# Patient Record
Sex: Male | Born: 1950 | Race: White | Hispanic: No | Marital: Married | Smoking: Former smoker
Health system: Southern US, Community
[De-identification: ages and names within clinical notes are randomized; demographics above are authoritative.]

## PROBLEM LIST (undated history)

## (undated) DIAGNOSIS — F329 Major depressive disorder, single episode, unspecified: Secondary | ICD-10-CM

## (undated) DIAGNOSIS — Z8619 Personal history of other infectious and parasitic diseases: Secondary | ICD-10-CM

## (undated) DIAGNOSIS — I10 Essential (primary) hypertension: Secondary | ICD-10-CM

## (undated) DIAGNOSIS — F32A Depression, unspecified: Secondary | ICD-10-CM

## (undated) HISTORY — DX: Depression, unspecified: F32.A

## (undated) HISTORY — DX: Major depressive disorder, single episode, unspecified: F32.9

## (undated) HISTORY — DX: Essential (primary) hypertension: I10

## (undated) HISTORY — DX: Personal history of other infectious and parasitic diseases: Z86.19

## (undated) HISTORY — PX: KNEE SURGERY: SHX244

---

## 2001-07-29 ENCOUNTER — Encounter: Admission: RE | Admit: 2001-07-29 | Discharge: 2001-07-29 | Payer: Self-pay | Admitting: Family Medicine

## 2001-07-29 ENCOUNTER — Encounter: Payer: Self-pay | Admitting: Family Medicine

## 2001-07-30 ENCOUNTER — Encounter: Payer: Self-pay | Admitting: Family Medicine

## 2001-07-30 ENCOUNTER — Encounter: Admission: RE | Admit: 2001-07-30 | Discharge: 2001-07-30 | Payer: Self-pay | Admitting: Family Medicine

## 2003-07-29 ENCOUNTER — Other Ambulatory Visit: Payer: Self-pay

## 2006-02-04 DIAGNOSIS — Z87891 Personal history of nicotine dependence: Secondary | ICD-10-CM | POA: Insufficient documentation

## 2008-02-03 DIAGNOSIS — I10 Essential (primary) hypertension: Secondary | ICD-10-CM | POA: Insufficient documentation

## 2008-08-22 ENCOUNTER — Ambulatory Visit: Payer: Self-pay

## 2008-10-26 ENCOUNTER — Ambulatory Visit: Payer: Self-pay | Admitting: General Practice

## 2011-12-12 LAB — HM COLONOSCOPY

## 2013-04-26 ENCOUNTER — Observation Stay: Payer: Self-pay | Admitting: Internal Medicine

## 2013-04-26 LAB — CBC
HCT: 47.5 % (ref 40.0–52.0)
HGB: 15.7 g/dL (ref 13.0–18.0)
MCH: 31.3 pg (ref 26.0–34.0)
MCHC: 33 g/dL (ref 32.0–36.0)
MCV: 95 fL (ref 80–100)
Platelet: 197 10*3/uL (ref 150–440)
RBC: 5.01 10*6/uL (ref 4.40–5.90)
RDW: 13.4 % (ref 11.5–14.5)
WBC: 12.5 10*3/uL — ABNORMAL HIGH (ref 3.8–10.6)

## 2013-04-26 LAB — COMPREHENSIVE METABOLIC PANEL
ALK PHOS: 70 U/L
Albumin: 3.9 g/dL (ref 3.4–5.0)
Anion Gap: 5 — ABNORMAL LOW (ref 7–16)
BILIRUBIN TOTAL: 0.5 mg/dL (ref 0.2–1.0)
BUN: 10 mg/dL (ref 7–18)
Calcium, Total: 8.8 mg/dL (ref 8.5–10.1)
Chloride: 108 mmol/L — ABNORMAL HIGH (ref 98–107)
Co2: 25 mmol/L (ref 21–32)
Creatinine: 0.87 mg/dL (ref 0.60–1.30)
Glucose: 133 mg/dL — ABNORMAL HIGH (ref 65–99)
Osmolality: 277 (ref 275–301)
Potassium: 4 mmol/L (ref 3.5–5.1)
SGOT(AST): 31 U/L (ref 15–37)
SGPT (ALT): 39 U/L (ref 12–78)
SODIUM: 138 mmol/L (ref 136–145)
TOTAL PROTEIN: 7.5 g/dL (ref 6.4–8.2)

## 2013-06-07 ENCOUNTER — Ambulatory Visit: Payer: Self-pay | Admitting: Family Medicine

## 2014-05-14 NOTE — Discharge Summary (Signed)
PATIENT NAME:  Brian Bishop, Brian Bishop MR#:  [id number] DATE OF BIRTH:  July 09, 1950  DATE OF ADMISSION:  04/26/2013 DATE OF DISCHARGE:  04/27/2013  PRIMARY DOCTOR: Lelon Huh, MD  DISCHARGE DIAGNOSES: 1. Acute low back pain due to lumbosacral strain.  2. Hypertension.   DISCHARGE MEDICATIONS:  1. Amlodipine 5 mg p.o. daily.  2. Aspirin 320 mg p.o. daily.  3. Percocet 5/325 mg 1 tablet every 4 hours as needed for pain.  4. Valium 5 mg p.o. every 6 hours as needed for back spasm. 5. Flexeril 10 mg every 8 hours as needed for muscle spasms. 6. Prednisone 20 mg 2 tablets daily for 2 days and 1 tab p.o. daily for 3 days and then stop.  DIET: Low-sodium diet.  The patient  was given on Flexeril for 10 days and diazepam for 10 days.   HOSPITAL COURSE: The patient is a 64 year old male patient who developed low back pain while he was working in the yard, and please look at the history and physical: The patient was admitted for severe back pain and these symptoms did not get better with IV Valium, Dilaudid, Decadron given in the ER, and we kept him in observation status for acute low back pain. The patient's white count was slightly up by 12.2 on admission,  likley due to stress, and kidney function was normal. The patient's symptoms improved the next day with steroids and a muscle relaxants and percocet.. The patient was able to go to the bathroom slowly, and I examined him on the day of discharge.   PHYSICAL EXAMINATION: CARDIOVASCULAR: S1, S2, regular.  LUNGS: Clear to auscultation.  MUSCULOSKELETAL: The patient has limited SLR test due to back pain on the left leg but on the right leg a SLR test is negative. The patient able to lift this leg straight on the right side and the patient said symptoms improved better than admission and I discharged him home with pain killers, muscle relaxant, especially Valium and short burst of prednisone.  The patient can follow up with his primary doctor. If the symptom  persists then he can have the imaging done.   TIME SPENT ON TISSUE PREPARATION: More than 30 minutes.    ____________________________ Epifanio Lesches, MD sk:lt D: 04/30/2013 22:37:43 ET T: 05/01/2013 05:07:04 ET JOB#: [id number]  cc: Epifanio Lesches, MD, <Dictator> Kirstie Peri. Caryn Section, MD Epifanio Lesches MD ELECTRONICALLY SIGNED 05/14/2013 15:00

## 2014-05-14 NOTE — H&P (Signed)
PATIENT NAME:  Brian Bishop, Brian Bishop MR#:  [id number] DATE OF BIRTH:  1950/08/19  DATE OF ADMISSION:  04/26/2013  PRIMARY CARE PHYSICIAN: Kirstie Peri. Fisher, MD  CHIEF COMPLAINT: Back pain and spasms.   HISTORY OF PRESENT ILLNESS: This is a 64 year old male who had developed some back pain a couple days back while he was working in the yard. He usually strains his back, takes some aspirin and rests his back and his symptoms improve although, over the past few days, the patient's symptoms have not been improving with supportive care. He has also been sleeping on an air mattress as they are remodeling their bedroom. The patient's pain has gotten progressively worse where he can barely get up a flight of stairs and he cannot even get up out of bed because he has significant back spasms. He, therefore, came to the ER for further evaluation. In the Emergency Room, the patient received aggressive therapy with IV Decadron along with IV Valium and Dilaudid and morphine for pain. His symptoms do improve but he is still having significant back spasms and difficulty ambulating. Hospitalist services were contacted for further treatment and evaluation.   REVIEW OF SYSTEMS:    CONSTITUTIONAL: No documented fever. No weight gain or weight loss.  EYES: No blurred or double vision.  ENT: No tinnitus. No postnasal drip. No redness of the oropharynx.  RESPIRATORY: No cough, no wheeze, no hemoptysis, no dyspnea.  CARDIOVASCULAR: No chest pain. No orthopnea, no palpitations, no syncope.  GASTROINTESTINAL: No nausea, vomiting, no diarrhea, no abdominal pain. No melena or hematochezia.  GENITOURINARY: No dysuria or hematuria.  ENDOCRINE: No polyuria or nocturia. No heat or cold intolerance.  HEMATOLOGIC:  No anemia, no bruising or bleeding.  INTEGUMENTARY: No rashes or lesions.  MUSCULOSKELETAL: No arthritis. No swelling. No gout.  NEUROLOGIC: No numbness or tingling. No ataxia. No seizure activity.  PSYCHIATRIC: No anxiety. No  insomnia. No ADD.  PAST MEDICAL HISTORY: Consistent with hypertension.   ALLERGIES: No known drug allergies.   SOCIAL HISTORY: Used to be a smoker but quit 20+ years ago. Does have a 25 pack-year smoking history. Does drink about 3 to 4 beers per day, maybe 4 to 6 on a weekend. No other illicit drug abuse. Lives at home with his wife.   FAMILY HISTORY: Both mother and father are alive. Mother is a breast cancer survivor. Father does not have any health problems.   CURRENT MEDICATIONS: As follows: Tylenol with hydrocodone 5/325 one tab t.i.d. as needed, amlodipine 5 mg daily, Naprosyn 500 mg 1 tab b.i.d. as needed.   PHYSICAL EXAMINATION: Presently is as follows:  VITAL SIGNS:  Temperature is 97, pulse 92, respirations 18, blood pressure 160/66, sats 94% on room air.  GENERAL: The patient is a pleasant-appearing male in mild distress.  HEAD, EYES, EARS, NOSE AND THROAT: Atraumatic, normocephalic. Extraocular muscles are intact. Pupils equal and reactive to light. Sclerae anicteric. No conjunctival injection. No pharyngeal erythema.  NECK: Supple. There is no jugular venous distention. No bruits, no lymphadenopathy, no thyromegaly.  HEART: Regular rate and rhythm. No murmurs. No rubs. No clicks.  LUNGS: Clear to auscultation bilaterally. No rales, no rhonchi, no wheezes.  ABDOMEN: Soft, flat, nontender, nondistended. Has good bowel sounds. No hepatosplenomegaly appreciated.  EXTREMITIES: No evidence of any cyanosis, clubbing or peripheral edema. Has +2 pedal and radial pulses bilaterally.  NEUROLOGICAL: The patient is alert, awake and oriented x 3 with no focal motor or sensory deficits appreciated bilaterally.  SKIN: Moist and  warm with no rashes appreciated.  LYMPHATIC: There is no cervical or axillary lymphadenopathy.   LABORATORY DATA: Serum glucose of 133, BUN 10, creatinine 0.8, sodium 138, potassium 4.0, chloride 108, bicarb 25. The patient's LFTs are within normal limits. White cell  count 12.5, hemoglobin 15.7, hematocrit 47.5, platelet count of 197.   ASSESSMENT AND PLAN: This is a 64 year old male with a history of hypertension and back problems who presented to the hospital due to back pain and difficulty ambulating. The patient received some Valium, steroids and pain meds in the RT but continues to have significant back with spasms and therefore is being admitted for pain control.  1.  Intractable back pain with spasms. There is no evidence of acute trauma, no evidence of any neuropathic pain. The patient has no sciatica type of symptoms, no urinary incontinence. There is no need for any acute imaging at this time. For now, I will admit the patient to the hospital for supportive care with IV pain control with Dilaudid, IV Valium, oral Flexeril and a prednisone taper.  2.  Hypertension. Continue Norvasc. The patient is presently hemodynamically stable.   Once patient's pain and spasms are under better control, he can likely be discharged home tomorrow morning.   CODE STATUS: The patient is a full code.   TIME SPENT: 45 minutes.     ____________________________ Belia Heman. Verdell Carmine, MD vjs:cs D: 04/26/2013 14:29:39 ET T: 04/26/2013 15:02:47 ET JOB#: [id number]  cc: Belia Heman. Verdell Carmine, MD, <Dictator> Henreitta Leber MD ELECTRONICALLY SIGNED 05/02/2013 18:47

## 2015-04-20 ENCOUNTER — Other Ambulatory Visit: Payer: Self-pay | Admitting: Family Medicine

## 2015-05-25 ENCOUNTER — Other Ambulatory Visit: Payer: Self-pay | Admitting: Family Medicine

## 2016-01-02 DIAGNOSIS — F329 Major depressive disorder, single episode, unspecified: Secondary | ICD-10-CM | POA: Insufficient documentation

## 2016-01-02 DIAGNOSIS — F32A Depression, unspecified: Secondary | ICD-10-CM | POA: Insufficient documentation

## 2016-01-03 ENCOUNTER — Encounter: Payer: Self-pay | Admitting: Family Medicine

## 2016-01-03 ENCOUNTER — Ambulatory Visit (INDEPENDENT_AMBULATORY_CARE_PROVIDER_SITE_OTHER): Payer: Commercial Managed Care - PPO | Admitting: Family Medicine

## 2016-01-03 DIAGNOSIS — I10 Essential (primary) hypertension: Secondary | ICD-10-CM

## 2016-01-03 MED ORDER — AMLODIPINE BESYLATE 5 MG PO TABS: 5.0000 mg | ORAL_TABLET | Freq: Every day | ORAL | 2 refills | Status: DC

## 2016-01-03 NOTE — Progress Notes (Signed)
       Patient: Brian Bishop Male    DOB: [birth year]   65 y.o.   MRN: [id number] Visit Date: 01/03/2016  Today's Provider: Lelon Huh, MD   Chief Complaint  Patient presents with  . Follow-up  . Hypertension   Subjective:    HPI     Hypertension, follow-up:  BP Readings from Last 3 Encounters:  01/03/16 (!) 160/98  02/14/14 132/60    He was last seen for hypertension 11 months ago.  BP at that visit was 132/60. Management since that visit includes; no changes.He reports good compliance with treatment. He is not having side effects. none He is exercising. He is adherent to low salt diet.   Outside blood pressures are not checking. He is experiencing none.  Patient denies none.   Cardiovascular risk factors include none.  Use of agents associated with hypertension: none.   ----------------------------------------------------------------    Allergies  Allergen Reactions  . Bee Venom      Current Outpatient Prescriptions:  .  amLODipine (NORVASC) 5 MG tablet, Take 1 tablet (5 mg total) by mouth daily. PATIENT NEEDS TO SCHEDULE OFFICE VISIT FOR FOLLOW UP, Disp: 30 tablet, Rfl: 2  Review of Systems  Constitutional: Negative for appetite change, chills and fever.  Respiratory: Negative for chest tightness, shortness of breath and wheezing.   Cardiovascular: Negative for chest pain and palpitations.  Gastrointestinal: Negative for abdominal pain, nausea and vomiting.    Social History  Substance Use Topics  . Smoking status: Former Smoker    Packs/day: 1.00    Years: 20.00    Quit date: 2001  . Smokeless tobacco: Not on file  . Alcohol use Not on file     Comment: drinks beer daily   Objective:   BP (!) 160/98   Pulse 69   Temp 98.2 F (36.8 C) (Oral)   Resp 16   Ht 5\' 11"  (1.803 m)   Wt 221 lb (100.2 kg)   SpO2 93%   BMI 30.82 kg/m   Depression screen [facility] 2/9 01/03/2016  Decreased Interest 0  Down, Depressed, Hopeless 0  PHQ - 2 Score 0    Altered sleeping 1  Tired, decreased energy 0  Change in appetite 0  Feeling bad or failure about yourself  0  Trouble concentrating 0  Moving slowly or fidgety/restless 0  Suicidal thoughts 0  PHQ-9 Score 1  Difficult doing work/chores Not difficult at all     Physical Exam   General Appearance:    Alert, cooperative, no distress  Eyes:    PERRL, conjunctiva/corneas clear, EOM's intact       Lungs:     Clear to auscultation bilaterally, respirations unlabored  Heart:    Regular rate and rhythm  Neurologic:   Awake, alert, oriented x 3. No apparent focal neurological           defect.           Assessment & Plan:      1. Essential (primary) hypertension BP up today. He is to work on diet exercise. Recheck BP after refilling amlodipine. Consider increasing to 10mg  if not improved at recheck.  - Lipid panel - Renal function panel - amLODipine (NORVASC) 5 MG tablet; Take 1 tablet (5 mg total) by mouth daily. PATIENT NEEDS TO SCHEDULE OFFICE VISIT FOR FOLLOW UP  Dispense: 30 tablet; Refill: 2       Lelon Huh, MD  [facility]

## 2016-01-11 LAB — BASIC METABOLIC PANEL
ALBUMIN/GLOB SERPL: 2
Albumin: 4.6
BUN: 8 mg/dL (ref 4–21)
CO2: 26
CREATININE: 0.9 mg/dL (ref 0.6–1.3)
Calcium: 9.6 mg/dL
Chloride: 106 mmol/L
EGFR (Non-African Amer.): 87
GFR CALC AF AMER: 101
Globulin: 2.3
Glucose: 87 mg/dL
POTASSIUM: 4.4 mmol/L (ref 3.4–5.3)
Sodium: 140 mmol/L (ref 137–147)
Total Protein: 6.9 g/dL

## 2016-01-11 LAB — HEPATIC FUNCTION PANEL
ALK PHOS: 61 U/L (ref 25–125)
ALT: 40 U/L (ref 10–40)
AST: 31 U/L (ref 14–40)
Bilirubin, Total: 0.7 mg/dL

## 2016-01-11 LAB — LIPID PANEL
CHOLESTEROL: 219 mg/dL — AB (ref 0–200)
HDL: 47 mg/dL (ref 35–70)
LDL CALC: 142 mg/dL
LDl/HDL Ratio: 4.7
NON-HDL CHOLESTEROL (CALC): 172
Triglycerides: 160 mg/dL (ref 40–160)

## 2016-01-17 ENCOUNTER — Encounter: Payer: Self-pay | Admitting: *Deleted

## 2016-01-19 ENCOUNTER — Encounter: Payer: Self-pay | Admitting: Family Medicine

## 2016-01-31 ENCOUNTER — Encounter: Payer: Self-pay | Admitting: Family Medicine

## 2016-01-31 ENCOUNTER — Ambulatory Visit (INDEPENDENT_AMBULATORY_CARE_PROVIDER_SITE_OTHER): Payer: Commercial Managed Care - PPO | Admitting: Family Medicine

## 2016-01-31 VITALS — BP 140/74 | HR 71 | Temp 98.1°F | Resp 16 | Ht 71.0 in | Wt 222.0 lb

## 2016-01-31 DIAGNOSIS — I10 Essential (primary) hypertension: Secondary | ICD-10-CM

## 2016-01-31 DIAGNOSIS — Z0000 Encounter for general adult medical examination without abnormal findings: Secondary | ICD-10-CM

## 2016-01-31 NOTE — Progress Notes (Signed)
Patient: Brian Bishop Male    DOB: 1950/12/03   66 y.o.   MRN: [id number] Visit Date: 01/31/2016  Today's Provider: Lelon Huh, MD   Chief Complaint  Patient presents with  . Annual Exam  . Hypertension   Subjective:    HPI  Present for routine exam, is employee of [employer] and requires exam and medical form completed. He feels well today with no complaints.   Results for orders placed or performed in visit on 16/01/09  Basic metabolic panel  Result Value Ref Range   Glucose 87 mg/dL   BUN 8 4 - 21 mg/dL   Creatinine 0.9 0.6 - 1.3 mg/dL   Potassium 4.4 3.4 - 5.3 mmol/L   Sodium 140 137 - 147 mmol/L   EGFR (African American) 101    EGFR (Non-African Amer.) 87    Chloride 106 mmol/L   Carbon Dioxide, Total 26    Calcium 9.6 mg/dL   Total Protein 6.9 g/dL   Albumin 4.6    Globulin 2.3    Albumin/Glob SerPl 2.0   Lipid panel  Result Value Ref Range   LDl/HDL Ratio 4.7    Triglycerides 160 40 - 160 mg/dL   Cholesterol 219 (A) 0 - 200 mg/dL   HDL 47 35 - 70 mg/dL   LDL Cholesterol 142 mg/dL   Non-HDL Cholesterol (Calc) 172   Hepatic function panel  Result Value Ref Range   Alkaline Phosphatase 61 25 - 125 U/L   ALT 40 10 - 40 U/L   AST 31 14 - 40 U/L   Bilirubin, Total 0.7 mg/dL     Hypertension, follow-up:  BP Readings from Last 3 Encounters:  01/31/16 140/74  01/03/16 (!) 160/98  02/14/14 132/60    He was last seen for hypertension 3 weeks ago.  BP at that visit was 160/98 but was out of his blood pressure pill.  Management since that visit includes; advised he is to work on diet exercise. Recheck BP after refilling amlodipine. Consider increasing to 27m if not improved at recheck.  .Marland Kitchene reports good compliance with treatment. He is not having side effects. none He is exercising. He is adherent to low salt diet.   Outside blood pressures are not checking. He is experiencing none.  Patient denies none.   Cardiovascular risk factors include  none.  Use of agents associated with hypertension: none.   ----------------------------------------------------------------  Past Medical History:  Diagnosis Date  . Depression   . History of chickenpox   . History of measles   . History of mumps   . Hypertension    Patient Active Problem List   Diagnosis Date Noted  . Depression 01/02/2016  . Essential (primary) hypertension 02/03/2008  . History of tobacco use 02/04/2006   Social History   Social History  . Marital status: Married    Spouse name: N/A  . Number of children: N/A  . Years of education: N/A   Occupational History  . Vice PSystems developerat [employer]  . Smoking status: Former Smoker    Packs/day: 1.00    Years: 20.00    Quit date: 2001  . Smokeless tobacco: Not on file  . Alcohol use Not on file     Comment: drinks beer daily  . Drug use: No  . Sexual activity: Not on file   Other Topics Concern  . Not on file  Social History Narrative  . No narrative on file   Past Surgical History:  Procedure Laterality Date  . KNEE SURGERY Right    2005 and 2010     Allergies  Allergen Reactions  . Bee Venom      Current Outpatient Prescriptions:  .  amLODipine (NORVASC) 5 MG tablet, Take 1 tablet (5 mg total) by mouth daily. PATIENT NEEDS TO SCHEDULE OFFICE VISIT FOR FOLLOW UP, Disp: 30 tablet, Rfl: 2  Review of Systems  Constitutional: Negative for appetite change, chills, diaphoresis and fever.  HENT: Negative for congestion, ear discharge, ear pain, hearing loss, nosebleeds, sore throat and tinnitus.   Eyes: Negative for photophobia, pain, discharge and redness.  Respiratory: Negative for cough, chest tightness, shortness of breath, wheezing and stridor.   Cardiovascular: Negative for chest pain, palpitations and leg swelling.  Gastrointestinal: Negative for abdominal pain, blood in stool, constipation, diarrhea, nausea  and vomiting.  Endocrine: Negative for polydipsia.  Genitourinary: Negative for dysuria, flank pain, frequency, hematuria and urgency.  Musculoskeletal: Negative for back pain, myalgias and neck pain.  Skin: Negative for rash.  Allergic/Immunologic: Negative for environmental allergies.  Neurological: Negative for dizziness, tremors, seizures, weakness and headaches.  Hematological: Does not bruise/bleed easily.  Psychiatric/Behavioral: Negative for hallucinations and suicidal ideas. The patient is not nervous/anxious.     Social History  Substance Use Topics  . Smoking status: Former Smoker    Packs/day: 1.00    Years: 20.00    Quit date: 2001  . Smokeless tobacco: Not on file  . Alcohol use Not on file     Comment: drinks beer daily   Objective:   BP 140/74 (BP Location: Right Arm, Patient Position: Sitting, Cuff Size: Large)   Pulse 71   Temp 98.1 F (36.7 C) (Oral)   Resp 16   Ht _0  (1.803 m)   Wt 222 lb (100.7 kg)   SpO2 97%   BMI 30.96 kg/m   Physical Exam   General Appearance:    Alert, cooperative, no distress, appears stated age, overweight  Head:    Normocephalic, without obvious abnormality, atraumatic  Eyes:    PERRL, conjunctiva/corneas clear, EOM's intact, fundi    benign, both eyes       Ears:    Normal TM's and external ear canals, both ears  Nose:   Nares normal, septum midline, mucosa normal, no drainage   or sinus tenderness  Throat:   Lips, mucosa, and tongue normal; teeth and gums normal  Neck:   Supple, symmetrical, trachea midline, no adenopathy;       thyroid:  No enlargement/tenderness/nodules; no carotid   bruit or JVD  Back:     Symmetric, no curvature, ROM normal, no CVA tenderness  Lungs:     Clear to auscultation bilaterally, respirations unlabored  Chest wall:    No tenderness or deformity  Heart:    Regular rate and rhythm, S1 and S2 normal, no murmur, rub   or gallop  Abdomen:     Soft, non-tender, bowel sounds active all four  quadrants,    no masses, no organomegaly  Genitalia:    deferred  Rectal:    deferred  Extremities:   Extremities normal, atraumatic, no cyanosis or edema  Pulses:   2+ and symmetric all extremities  Skin:   Skin color, texture, turgor normal, no rashes or lesions  Lymph nodes:   Cervical, supraclavicular, and axillary nodes normal  Neurologic:   CNII-XII intact. Normal strength, sensation and  reflexes      throughout       Assessment & Plan:     1. Annual physical exam Generally doing well. Completed forms for [employer] for employment with no restrictions. Refused flu vaccine.   2. Essential (primary) hypertension Controlled on current dose of amlodipine. Briefly reviewed healthy diet and exercise.        Lelon Huh, MD  [facility]

## 2016-03-24 ENCOUNTER — Other Ambulatory Visit: Payer: Self-pay | Admitting: Family Medicine

## 2016-03-24 DIAGNOSIS — I10 Essential (primary) hypertension: Secondary | ICD-10-CM

## 2016-09-05 ENCOUNTER — Encounter (HOSPITAL_COMMUNITY): Payer: Self-pay | Admitting: Emergency Medicine

## 2016-09-05 ENCOUNTER — Other Ambulatory Visit: Payer: Self-pay

## 2016-09-05 ENCOUNTER — Emergency Department (HOSPITAL_COMMUNITY)
Admission: EM | Admit: 2016-09-05 | Discharge: 2016-09-05 | Disposition: A | Discharge status: Home / Self Care | Payer: Commercial Managed Care - PPO | Attending: Emergency Medicine | Admitting: Emergency Medicine

## 2016-09-05 DIAGNOSIS — I10 Essential (primary) hypertension: Secondary | ICD-10-CM | POA: Diagnosis not present

## 2016-09-05 DIAGNOSIS — Z79899 Other long term (current) drug therapy: Secondary | ICD-10-CM | POA: Diagnosis not present

## 2016-09-05 DIAGNOSIS — Z7982 Long term (current) use of aspirin: Secondary | ICD-10-CM | POA: Insufficient documentation

## 2016-09-05 DIAGNOSIS — R55 Syncope and collapse: Secondary | ICD-10-CM | POA: Insufficient documentation

## 2016-09-05 LAB — URINALYSIS, ROUTINE W REFLEX MICROSCOPIC
Bilirubin Urine: NEGATIVE
GLUCOSE, UA: NEGATIVE mg/dL
Hgb urine dipstick: NEGATIVE
Ketones, ur: NEGATIVE mg/dL
LEUKOCYTES UA: NEGATIVE
NITRITE: NEGATIVE
PH: 7 (ref 5.0–8.0)
Protein, ur: NEGATIVE mg/dL
SPECIFIC GRAVITY, URINE: 1.006 (ref 1.005–1.030)

## 2016-09-05 LAB — CBC
HEMATOCRIT: 43.8 % (ref 39.0–52.0)
HEMOGLOBIN: 15.4 g/dL (ref 13.0–17.0)
MCH: 32.8 pg (ref 26.0–34.0)
MCHC: 35.2 g/dL (ref 30.0–36.0)
MCV: 93.4 fL (ref 78.0–100.0)
Platelets: 200 10*3/uL (ref 150–400)
RBC: 4.69 MIL/uL (ref 4.22–5.81)
RDW: 13.3 % (ref 11.5–15.5)
WBC: 8.1 10*3/uL (ref 4.0–10.5)

## 2016-09-05 LAB — BASIC METABOLIC PANEL
ANION GAP: 9 (ref 5–15)
BUN: 7 mg/dL (ref 6–20)
CHLORIDE: 103 mmol/L (ref 101–111)
CO2: 25 mmol/L (ref 22–32)
Calcium: 9.2 mg/dL (ref 8.9–10.3)
Creatinine, Ser: 1.04 mg/dL (ref 0.61–1.24)
GFR calc Af Amer: >60 mL/min (ref >60)
GLUCOSE: 168 mg/dL — AB (ref 65–99)
POTASSIUM: 3.6 mmol/L (ref 3.5–5.1)
Sodium: 137 mmol/L (ref 135–145)

## 2016-09-05 LAB — I-STAT TROPONIN, ED: Troponin i, poc: 0 ng/mL (ref 0.00–0.08)

## 2016-09-05 NOTE — ED Notes (Signed)
Pt drinking ginger ale. Denied dizziness with standing for urine

## 2016-09-05 NOTE — Discharge Instructions (Signed)
If you feel dizzy, lie down.  Return here, or see your doctor, as needed for problems.

## 2016-09-05 NOTE — ED Notes (Signed)
Pt verbalized understanding of discharge instructions and denies any further questions at this time.   

## 2016-09-05 NOTE — ED Provider Notes (Signed)
[facility] DEPT Provider Note   CSN: [id number] Arrival date & time: 09/05/16  1339     History   Chief Complaint Chief Complaint  Patient presents with  . Loss of Consciousness    HPI Brian Bishop is a 65 y.o. male.  He is here for evaluation of an episode of syncope, which occurred while eating. He recalls eating [place] at [employer] when he felt a catch in the sternal region, while swallowing. It felt "like food was going down sideways." He noticed that he felt dizzy and weak, then remembers waking up with EMTs attending to him. His friends told him that he passed out, but did not fall out of the chair. He recovered his senses quickly, and was transferred here by EMS. Reportedly during transfer, he had some episodes of bigeminy. He has not had this happen previously. He does not have a swallowing disorder. He does not feel that he choked or had any respiratory difficulty during the episode. There was no vomiting. He does not have cardiac disease. He denies recent fever, chills, cough, shortness of breath, nausea, vomiting, weakness or dizziness. There are no other known modifying factors.  HPI  Past Medical History:  Diagnosis Date  . Depression   . History of chickenpox   . History of measles   . History of mumps   . Hypertension     Patient Active Problem List   Diagnosis Date Noted  . Depression 01/02/2016  . Essential (primary) hypertension 02/03/2008  . History of tobacco use 02/04/2006    Past Surgical History:  Procedure Laterality Date  . KNEE SURGERY Right    2005 and 2010       Home Medications    Prior to Admission medications   Medication Sig Start Date End Date Taking? Authorizing Provider  amLODipine (NORVASC) 5 MG tablet Take 1 tablet (5 mg total) by mouth daily. 03/24/16  Yes Birdie Sons, MD  Aspirin-Acetaminophen-Caffeine (GOODY HEADACHE PO) Take 1 packet by mouth daily as needed (pain).   Yes [provider]    Family  History Family History  Problem Relation Age of Onset  . Breast cancer Mother     Social History Social History  Substance Use Topics  . Smoking status: Former Smoker    Packs/day: 1.00    Years: 20.00    Quit date: 2001  . Smokeless tobacco: Never Used  . Alcohol use Yes     Comment: drinks beer daily     Allergies   Bee venom   Review of Systems Review of Systems  All other systems reviewed and are negative.    Physical Exam Updated Vital Signs BP (!) 155/72 (BP Location: Right Arm)   Pulse 80   Temp 98.1 F (36.7 C)   Resp 15   Ht 6' (1.829 m)   Wt 95.3 kg (210 lb)   SpO2 97%   BMI 28.48 kg/m   Physical Exam  Constitutional: He is oriented to person, place, and time. He appears well-developed and well-nourished. No distress.  HENT:  Head: Normocephalic and atraumatic.  Right Ear: External ear normal.  Left Ear: External ear normal.  Eyes: Pupils are equal, round, and reactive to light. Conjunctivae and EOM are normal.  Neck: Normal range of motion and phonation normal. Neck supple.  Cardiovascular: Normal rate, regular rhythm and normal heart sounds.   Pulmonary/Chest: Effort normal and breath sounds normal. He exhibits no bony tenderness.  Abdominal: Soft. There is no  tenderness.  Musculoskeletal: Normal range of motion. He exhibits no edema or tenderness.  Neurological: He is alert and oriented to person, place, and time. No cranial nerve deficit or sensory deficit. He exhibits normal muscle tone. Coordination normal.  Skin: Skin is warm, dry and intact.  Psychiatric: He has a normal mood and affect. His behavior is normal. Judgment and thought content normal.  Nursing note and vitals reviewed.    ED Treatments / Results  Labs (all labs ordered are listed, but only abnormal results are displayed) Labs Reviewed  BASIC METABOLIC PANEL - Abnormal; Notable for the following:       Result Value   Glucose, Bld 168 (*)    All other components within  normal limits  CBC  URINALYSIS, ROUTINE W REFLEX MICROSCOPIC  I-STAT TROPONIN, ED  CBG MONITORING, ED    EKG  EKG Interpretation  Date/Time:  Thursday September 05 2016 13:35:02 EDT Ventricular Rate:  73 PR Interval:  176 QRS Duration: 76 QT Interval:  306 QTC Calculation: 337 R Axis:   59 Text Interpretation:  Normal sinus rhythm Low voltage QRS Nonspecific T wave abnormality Abnormal ECG since last tracing no significant change Confirmed by Daleen Bo [phone]) on 09/05/2016 3:39:14 PM       Radiology No results found.  Procedures Procedures (including critical care time)  Medications Ordered in ED Medications - No data to display   Initial Impression / Assessment and Plan / ED Course  I have reviewed the triage vital signs and the nursing notes.  Pertinent labs & imaging results that were available during my care of the patient were reviewed by me and considered in my medical decision making (see chart for details).  Clinical Course as of Sep 06 1710  Thu Sep 05, 2016  1708 Blood pressure right arm, automated, by me, 131/73.  [EW]    Clinical Course User Index [EW] Daleen Bo, MD     Patient Vitals for the past 24 hrs:  BP Temp Temp src Pulse Resp SpO2 Height Weight  09/05/16 1638 - 98.1 F (36.7 C) - - - - - -  09/05/16 1408 (!) 155/72 - - 80 15 97 % 6' (1.829 m) 95.3 kg (210 lb)  09/05/16 1343 138/64 97.9 F (36.6 C) Oral 71 18 97 % - -  09/05/16 1339 - - - - - 97 % - -    5:12 PM Reevaluation with update and discussion. After initial assessment and treatment, an updated evaluation reveals He remains comfortable. He has tolerated crackers and liquid here. Findings discussed with patient and wife, all questions answered. Nichole Neyer L     Final Clinical Impressions(s) / ED Diagnoses   Final diagnoses:  Syncope, unspecified syncope type   Syncope associated with swallowing difficulty, which has resolved. Suspect vasovagal episode, without  persistent symptoms.  Nursing Notes Reviewed/ Care Coordinated Applicable Imaging Reviewed Interpretation of Laboratory Data incorporated into ED treatment  The patient appears reasonably screened and/or stabilized for discharge and I doubt any other medical condition or other [facility] requiring further screening, evaluation, or treatment in the ED at this time prior to discharge.  Plan: Home Medications- continue usual; Home Treatments- rest, fluids; return here if the recommended treatment, does not improve the symptoms; Recommended follow up- PCP, when necessary   New Prescriptions New Prescriptions   No medications on file     Daleen Bo, MD 09/05/16 [phone]

## 2016-09-05 NOTE — ED Triage Notes (Signed)
EMS- pressure in center of chest, felt the need to burp. Unable to do so, became pale with LOC approx 1 min. Currently at baseline. EKG and vitals. Hx of hypertension takes amlodipine.

## 2017-03-15 ENCOUNTER — Other Ambulatory Visit: Payer: Self-pay | Admitting: Family Medicine

## 2017-03-15 DIAGNOSIS — I10 Essential (primary) hypertension: Secondary | ICD-10-CM

## 2017-04-13 ENCOUNTER — Other Ambulatory Visit: Payer: Self-pay | Admitting: Family Medicine

## 2017-04-13 DIAGNOSIS — I10 Essential (primary) hypertension: Secondary | ICD-10-CM

## 2017-05-11 ENCOUNTER — Other Ambulatory Visit: Payer: Self-pay | Admitting: Family Medicine

## 2017-05-11 DIAGNOSIS — I10 Essential (primary) hypertension: Secondary | ICD-10-CM

## 2017-06-08 ENCOUNTER — Other Ambulatory Visit: Payer: Self-pay | Admitting: Family Medicine

## 2017-06-08 DIAGNOSIS — I10 Essential (primary) hypertension: Secondary | ICD-10-CM

## 2017-07-07 ENCOUNTER — Other Ambulatory Visit: Payer: Self-pay | Admitting: Family Medicine

## 2017-07-07 DIAGNOSIS — I10 Essential (primary) hypertension: Secondary | ICD-10-CM

## 2017-07-28 ENCOUNTER — Other Ambulatory Visit: Payer: Self-pay | Admitting: Family Medicine

## 2017-07-28 DIAGNOSIS — I10 Essential (primary) hypertension: Secondary | ICD-10-CM

## 2017-07-28 NOTE — Telephone Encounter (Signed)
Pt advised.  He will call back to schedule an appointment.   Thanks,   -Mickel Baas

## 2017-07-28 NOTE — Telephone Encounter (Signed)
Please advise patient has been over a year since last o.v. Need to schedule o.v. This month. Have sent one prescription with no refills to get by until o.v.

## 2017-08-25 ENCOUNTER — Other Ambulatory Visit: Payer: Self-pay | Admitting: Family Medicine

## 2017-08-25 DIAGNOSIS — I10 Essential (primary) hypertension: Secondary | ICD-10-CM

## 2017-09-21 ENCOUNTER — Other Ambulatory Visit: Payer: Self-pay | Admitting: Family Medicine

## 2017-09-21 DIAGNOSIS — I10 Essential (primary) hypertension: Secondary | ICD-10-CM

## 2017-12-14 ENCOUNTER — Other Ambulatory Visit: Payer: Self-pay | Admitting: Family Medicine

## 2017-12-14 DIAGNOSIS — I10 Essential (primary) hypertension: Secondary | ICD-10-CM

## 2018-01-25 ENCOUNTER — Other Ambulatory Visit: Payer: Self-pay | Admitting: Family Medicine

## 2018-01-25 DIAGNOSIS — I10 Essential (primary) hypertension: Secondary | ICD-10-CM

## 2018-03-04 ENCOUNTER — Ambulatory Visit (INDEPENDENT_AMBULATORY_CARE_PROVIDER_SITE_OTHER): Payer: Medicare Other | Admitting: Family Medicine

## 2018-03-04 ENCOUNTER — Encounter: Payer: Self-pay | Admitting: Family Medicine

## 2018-03-04 VITALS — BP 162/80 | HR 64 | Temp 97.7°F | Resp 16 | Wt 215.0 lb

## 2018-03-04 DIAGNOSIS — I10 Essential (primary) hypertension: Secondary | ICD-10-CM | POA: Diagnosis not present

## 2018-03-04 DIAGNOSIS — Z125 Encounter for screening for malignant neoplasm of prostate: Secondary | ICD-10-CM

## 2018-03-04 DIAGNOSIS — E785 Hyperlipidemia, unspecified: Secondary | ICD-10-CM

## 2018-03-04 DIAGNOSIS — Z1159 Encounter for screening for other viral diseases: Secondary | ICD-10-CM

## 2018-03-04 MED ORDER — AMLODIPINE BESYLATE 5 MG PO TABS: 5.0000 mg | ORAL_TABLET | Freq: Every day | ORAL | 5 refills | Status: DC

## 2018-03-04 NOTE — Patient Instructions (Signed)
.   Please review the attached list of medications and notify my office if there are any errors.   . Please bring all of your medications to every appointment so we can make sure that our medication list is the same as yours.   

## 2018-03-04 NOTE — Progress Notes (Signed)
Patient: Brian Bishop Male    DOB: 1950/05/29   68 y.o.   MRN: [id number] Visit Date: 03/04/2018  Today's Provider: Lelon Huh, MD   Chief Complaint  Patient presents with  . Hypertension   Subjective:     HPI  Hypertension, follow-up:  BP Readings from Last 3 Encounters:  03/04/18 (!) 162/80  09/05/16 138/73  01/31/16 140/74   Lipid Panel     Component Value Date/Time   CHOL 219 (A) 01/11/2016   TRIG 160 01/11/2016   HDL 47 01/11/2016   LDLCALC 142 01/11/2016    He was last seen for hypertension 2 years ago.  BP at that visit was 140/74. Management since that visit includes no changes. He reports good compliance with treatment. Patient has been out of medication for the past 3-4 days. He is not having side effects.  He is not exercising. He is adherent to low salt diet.   Outside blood pressures are checked occasionally. He is experiencing none.  Patient denies chest pain, chest pressure/discomfort, claudication, dyspnea, exertional chest pressure/discomfort, fatigue, irregular heart beat, lower extremity edema, near-syncope, orthopnea, palpitations, paroxysmal nocturnal dyspnea, syncope and tachypnea.   Cardiovascular risk factors include advanced age (older than 24 for men, 57 for women), hypertension and male gender.  Use of agents associated with hypertension: none.     Weight trend: fluctuating a bit Wt Readings from Last 3 Encounters:  03/04/18 215 lb (97.5 kg)  09/05/16 210 lb (95.3 kg)  01/31/16 222 lb (100.7 kg)    Current diet: well balanced  ------------------------------------------------------------------------  Allergies  Allergen Reactions  . Bee Venom Swelling and Other (See Comments)    "turned red"     Current Outpatient Medications:  .  amLODipine (NORVASC) 5 MG tablet, TAKE 1 TABLET BY MOUTH EVERY DAY, Disp: 30 tablet, Rfl: 0  Review of Systems  Constitutional: Negative for appetite change, chills and fever.    Respiratory: Negative for chest tightness, shortness of breath and wheezing.   Cardiovascular: Negative for chest pain and palpitations.  Gastrointestinal: Negative for abdominal pain, nausea and vomiting.    Social History   Tobacco Use  . Smoking status: Former Smoker    Packs/day: 1.00    Years: 20.00    Pack years: 20.00    Last attempt to quit: 2001    Years since quitting: 19.1  . Smokeless tobacco: Never Used  Substance Use Topics  . Alcohol use: Yes    Comment: drinks beer daily      Objective:   BP (!) 162/80 (BP Location: Right Arm, Cuff Size: Large)   Pulse 64   Temp 97.7 F (36.5 C) (Oral)   Resp 16   Wt 215 lb (97.5 kg)   SpO2 97% Comment: room air  BMI 29.16 kg/m  Vitals:   03/04/18 1504 03/04/18 1508  BP: (!) 160/70 (!) 162/80  Pulse: 64   Resp: 16   Temp: 97.7 F (36.5 C)   TempSrc: Oral   SpO2: 97%   Weight: 215 lb (97.5 kg)      Physical Exam   General Appearance:    Alert, cooperative, no distress  Eyes:    PERRL, conjunctiva/corneas clear, EOM's intact       Lungs:     Clear to auscultation bilaterally, respirations unlabored  Heart:    Regular rate and rhythm  Neurologic:   Awake, alert, oriented x 3. No apparent focal neurological  defect.          Assessment & Plan    1. Essential (primary) hypertension Currently off of- amLODipine (NORVASC) 5 MG tablet; Take 1 tablet (5 mg total) by mouth daily.  Dispense: 30 tablet; Refill: 5 Is to restart and return in about 9months for BP check - EKG 12-Lead - Renal function panel  2. Prostate cancer screening  - PSA  3. Hyperlipidemia, unspecified hyperlipidemia type  - Lipid panel  4. Need for hepatitis C screening test  - Hepatitis C antibody     Lelon Huh, MD  [facility]

## 2018-03-05 ENCOUNTER — Telehealth: Payer: Self-pay

## 2018-03-05 ENCOUNTER — Encounter: Payer: Self-pay | Admitting: Family Medicine

## 2018-03-05 LAB — RENAL FUNCTION PANEL
ALBUMIN: 4.7 g/dL (ref 3.8–4.8)
BUN/Creatinine Ratio: 13 (ref 10–24)
BUN: 12 mg/dL (ref 8–27)
CALCIUM: 9.9 mg/dL (ref 8.6–10.2)
CO2: 20 mmol/L (ref 20–29)
CREATININE: 0.92 mg/dL (ref 0.76–1.27)
Chloride: 105 mmol/L (ref 96–106)
GFR calc Af Amer: 101 mL/min/{1.73_m2} (ref >59)
GFR, EST NON AFRICAN AMERICAN: 87 mL/min/{1.73_m2} (ref >59)
GLUCOSE: 72 mg/dL (ref 65–99)
POTASSIUM: 4 mmol/L (ref 3.5–5.2)
Phosphorus: 3.1 mg/dL (ref 2.8–4.1)
SODIUM: 139 mmol/L (ref 134–144)

## 2018-03-05 LAB — PSA: PROSTATE SPECIFIC AG, SERUM: 0.3 ng/mL (ref 0.0–4.0)

## 2018-03-05 LAB — HEPATITIS C ANTIBODY: Hep C Virus Ab: <0.1 s/co ratio (ref 0.0–0.9)

## 2018-03-05 LAB — LIPID PANEL
CHOLESTEROL TOTAL: 207 mg/dL — AB (ref 100–199)
Chol/HDL Ratio: 4.4 ratio (ref 0.0–5.0)
HDL: 47 mg/dL (ref >39)
LDL CALC: 107 mg/dL — AB (ref 0–99)
TRIGLYCERIDES: 263 mg/dL — AB (ref 0–149)
VLDL Cholesterol Cal: 53 mg/dL — ABNORMAL HIGH (ref 5–40)

## 2018-03-05 NOTE — Telephone Encounter (Signed)
-----   Message from Birdie Sons, MD sent at 03/05/2018  1:16 PM EST ----- Cholesterol is a little high at 207. Otherwise labs are good. Continue current medications. Cut back on sweets and starches in diet  Follow up BP check in may as scheduled.

## 2018-03-05 NOTE — Telephone Encounter (Signed)
Pt advised.   Thanks,   -Zanai Mallari  

## 2018-03-09 ENCOUNTER — Ambulatory Visit: Payer: Commercial Managed Care - PPO | Admitting: Family Medicine

## 2018-06-10 ENCOUNTER — Ambulatory Visit: Payer: Self-pay | Admitting: Family Medicine

## 2018-07-27 ENCOUNTER — Other Ambulatory Visit: Payer: Self-pay | Admitting: Family Medicine

## 2018-07-27 DIAGNOSIS — I10 Essential (primary) hypertension: Secondary | ICD-10-CM

## 2018-08-25 ENCOUNTER — Other Ambulatory Visit: Payer: Self-pay | Admitting: Family Medicine

## 2018-08-25 DIAGNOSIS — I10 Essential (primary) hypertension: Secondary | ICD-10-CM

## 2018-08-31 ENCOUNTER — Other Ambulatory Visit: Payer: Self-pay | Admitting: Family Medicine

## 2018-08-31 DIAGNOSIS — I10 Essential (primary) hypertension: Secondary | ICD-10-CM

## 2018-09-29 ENCOUNTER — Other Ambulatory Visit: Payer: Self-pay | Admitting: Family Medicine

## 2018-09-29 DIAGNOSIS — I10 Essential (primary) hypertension: Secondary | ICD-10-CM

## 2018-10-21 ENCOUNTER — Other Ambulatory Visit: Payer: Self-pay | Admitting: Family Medicine

## 2018-10-21 DIAGNOSIS — I10 Essential (primary) hypertension: Secondary | ICD-10-CM

## 2018-11-12 ENCOUNTER — Other Ambulatory Visit: Payer: Self-pay | Admitting: Family Medicine

## 2018-11-12 DIAGNOSIS — I10 Essential (primary) hypertension: Secondary | ICD-10-CM

## 2018-12-05 ENCOUNTER — Other Ambulatory Visit: Payer: Self-pay | Admitting: Family Medicine

## 2018-12-05 DIAGNOSIS — I10 Essential (primary) hypertension: Secondary | ICD-10-CM

## 2018-12-29 ENCOUNTER — Other Ambulatory Visit: Payer: Self-pay | Admitting: Family Medicine

## 2018-12-29 DIAGNOSIS — I10 Essential (primary) hypertension: Secondary | ICD-10-CM

## 2019-01-21 ENCOUNTER — Other Ambulatory Visit: Payer: Self-pay | Admitting: Family Medicine

## 2019-01-21 DIAGNOSIS — I10 Essential (primary) hypertension: Secondary | ICD-10-CM

## 2019-02-02 ENCOUNTER — Ambulatory Visit (INDEPENDENT_AMBULATORY_CARE_PROVIDER_SITE_OTHER): Payer: Medicare Other | Admitting: Family Medicine

## 2019-02-02 ENCOUNTER — Encounter: Payer: Self-pay | Admitting: Family Medicine

## 2019-02-02 ENCOUNTER — Other Ambulatory Visit: Payer: Self-pay

## 2019-02-02 DIAGNOSIS — I10 Essential (primary) hypertension: Secondary | ICD-10-CM | POA: Diagnosis not present

## 2019-02-02 MED ORDER — AMLODIPINE BESYLATE 10 MG PO TABS: 10.0000 mg | ORAL_TABLET | Freq: Every day | ORAL | 3 refills | Status: DC

## 2019-02-02 NOTE — Progress Notes (Signed)
Patient: Brian Bishop Male    DOB: [birth year]   69 y.o.   MRN: [id number] Visit Date: 02/02/2019  Today's Provider: Lelon Huh, MD   Chief Complaint  Patient presents with  . Hypertension   Subjective:     HPI  Hypertension, follow-up:  BP Readings from Last 3 Encounters:  02/02/19 (!) 160/69  03/04/18 (!) 162/80  09/05/16 138/73    He was last seen for hypertension 1 years ago.  BP at that visit was 162/80. Management changes since that visit include no change He reports good compliance with treatment. He is not having side effects.  He is not exercising. He is adherent to low salt diet.   Outside blood pressures are being checked at home.  He is experiencing none.  Patient denies chest pain, chest pressure/discomfort, irregular heart beat and palpitations.   Cardiovascular risk factors include advanced age (older than 42 for men, 15 for women), hypertension and male gender.  Use of agents associated with hypertension: none.     Weight trend: increasing steadily Wt Readings from Last 3 Encounters:  02/02/19 220 lb 3.2 oz (99.9 kg)  03/04/18 215 lb (97.5 kg)  09/05/16 210 lb (95.3 kg)    Current diet: well balanced  ------------------------------------------------------------------------  Allergies  Allergen Reactions  . Bee Venom Swelling and Other (See Comments)    "turned red"     Current Outpatient Medications:  .  amLODipine (NORVASC) 5 MG tablet, TAKE 1 TABLET BY MOUTH EVERY DAY, Disp: 30 tablet, Rfl: 1  Review of Systems  Constitutional: Negative.   Respiratory: Negative.   Cardiovascular: Negative.   Musculoskeletal: Negative.     Social History   Tobacco Use  . Smoking status: Former Smoker    Packs/day: 1.00    Years: 20.00    Pack years: 20.00    Quit date: 2001    Years since quitting: 20.0  . Smokeless tobacco: Never Used  Substance Use Topics  . Alcohol use: Yes    Comment: drinks beer daily      Objective:   BP (!) 160/69 (BP Location: Right Arm, Patient Position: Sitting, Cuff Size: Normal)   Pulse 72   Temp (!) 97.3 F (36.3 C) (Temporal)   Ht 5\' 11"  (1.803 m)   Wt 220 lb 3.2 oz (99.9 kg)   BMI 30.71 kg/m  Vitals:   02/02/19 1341  BP: (!) 160/69  Pulse: 72  Temp: (!) 97.3 F (36.3 C)  TempSrc: Temporal  Weight: 220 lb 3.2 oz (99.9 kg)  Height: 5\' 11"  (1.803 m)  Body mass index is 30.71 kg/m.   Physical Exam   General Appearance:    Obese male in no acute distress  Eyes:    PERRL, conjunctiva/corneas clear, EOM's intact       Lungs:     Clear to auscultation bilaterally, respirations unlabored  Heart:    Normal heart rate. Normal rhythm. No murmurs, rubs, or gallops.   MS:   All extremities are intact.   Neurologic:   Awake, alert, oriented x 3. No apparent focal neurological           defect.        No results found for any visits on 02/02/19.     Assessment & Plan    1. Essential (primary) hypertension Uncontrolled, increase from 5mg  to - amLODipine (NORVASC) 10 MG tablet; Take 1 tablet (10 mg total) by mouth daily.  Dispense: 30 tablet; Refill: 3  Follow up next month for BP check.     Lelon Huh, MD  [facility]

## 2019-02-02 NOTE — Patient Instructions (Signed)
DASH Eating Plan DASH stands for "Dietary Approaches to Stop Hypertension." The DASH eating plan is a healthy eating plan that has been shown to reduce high blood pressure (hypertension). It may also reduce your risk for type 2 diabetes, heart disease, and stroke. The DASH eating plan may also help with weight loss. What are tips for following this plan?  General guidelines Avoid eating more than 2,300 mg (milligrams) of salt (sodium) a day. If you have hypertension, you may need to reduce your sodium intake to 1,500 mg a day. Limit alcohol intake to no more than 1 drink a day for nonpregnant women and 2 drinks a day for men. One drink equals 12 oz of beer, 5 oz of wine, or 1 oz of hard liquor. Work with your health care provider to maintain a healthy body weight or to lose weight. Ask what an ideal weight is for you. Get at least 30 minutes of exercise that causes your heart to beat faster (aerobic exercise) most days of the week. Activities may include walking, swimming, or biking. Work with your health care provider or diet and nutrition specialist (dietitian) to adjust your eating plan to your individual calorie needs. Reading food labels  Check food labels for the amount of sodium per serving. Choose foods with less than 5 percent of the Daily Value of sodium. Generally, foods with less than 300 mg of sodium per serving fit into this eating plan. To find whole grains, look for the word "whole" as the first word in the ingredient list. Shopping Buy products labeled as "low-sodium" or "no salt added." Buy fresh foods. Avoid canned foods and premade or frozen meals. Cooking Avoid adding salt when cooking. Use salt-free seasonings or herbs instead of table salt or sea salt. Check with your health care provider or pharmacist before using salt substitutes. Do not fry foods. Cook foods using healthy methods such as baking, boiling, grilling, and broiling instead. Cook with heart-healthy oils, such  as olive, canola, soybean, or sunflower oil. Meal planning Eat a balanced diet that includes: 5 or more servings of fruits and vegetables each day. At each meal, try to fill half of your plate with fruits and vegetables. Up to 6-8 servings of whole grains each day. Less than 6 oz of lean meat, poultry, or fish each day. A 3-oz serving of meat is about the same size as a deck of cards. One egg equals 1 oz. 2 servings of low-fat dairy each day. A serving of nuts, seeds, or beans 5 times each week. Heart-healthy fats. Healthy fats called Omega-3 fatty acids are found in foods such as flaxseeds and coldwater fish, like sardines, salmon, and mackerel. Limit how much you eat of the following: Canned or prepackaged foods. Food that is high in trans fat, such as fried foods. Food that is high in saturated fat, such as fatty meat. Sweets, desserts, sugary drinks, and other foods with added sugar. Full-fat dairy products. Do not salt foods before eating. Try to eat at least 2 vegetarian meals each week. Eat more home-cooked food and less restaurant, buffet, and fast food. When eating at a restaurant, ask that your food be prepared with less salt or no salt, if possible. What foods are recommended? The items listed may not be a complete list. Talk with your dietitian about what dietary choices are best for you. Grains Whole-grain or whole-wheat bread. Whole-grain or whole-wheat pasta. Brown rice. [place]. Bulgur. Whole-grain and low-sodium cereals. Pita bread. Low-fat, low-sodium  crackers. Whole-wheat flour tortillas. Vegetables Fresh or frozen vegetables (raw, steamed, roasted, or grilled). Low-sodium or reduced-sodium tomato and vegetable juice. Low-sodium or reduced-sodium tomato sauce and tomato paste. Low-sodium or reduced-sodium canned vegetables. Fruits All fresh, dried, or frozen fruit. Canned fruit in natural juice (without added sugar). Meat and other protein foods Skinless chicken  or [place]. Ground chicken or [place]. Pork with fat trimmed off. Fish and seafood. Egg whites. Dried beans, peas, or lentils. Unsalted nuts, nut butters, and seeds. Unsalted canned beans. Lean cuts of beef with fat trimmed off. Low-sodium, lean deli meat. Dairy Low-fat (1%) or fat-free (skim) milk. Fat-free, low-fat, or reduced-fat cheeses. Nonfat, low-sodium ricotta or cottage cheese. Low-fat or nonfat yogurt. Low-fat, low-sodium cheese. Fats and oils Soft margarine without trans fats. Vegetable oil. Low-fat, reduced-fat, or light mayonnaise and salad dressings (reduced-sodium). Canola, safflower, olive, soybean, and sunflower oils. Avocado. Seasoning and other foods Herbs. Spices. Seasoning mixes without salt. Unsalted popcorn and pretzels. Fat-free sweets. What foods are not recommended? The items listed may not be a complete list. Talk with your dietitian about what dietary choices are best for you. Grains Baked goods made with fat, such as croissants, muffins, or some breads. Dry pasta or rice meal packs. Vegetables Creamed or fried vegetables. Vegetables in a cheese sauce. Regular canned vegetables (not low-sodium or reduced-sodium). Regular canned tomato sauce and paste (not low-sodium or reduced-sodium). Regular tomato and vegetable juice (not low-sodium or reduced-sodium). Angie Fava. Olives. Fruits Canned fruit in a light or heavy syrup. Fried fruit. Fruit in cream or butter sauce. Meat and other protein foods Fatty cuts of meat. Ribs. Fried meat. Berniece Salines. Sausage. Bologna and other processed lunch meats. Salami. Fatback. Hotdogs. Bratwurst. Salted nuts and seeds. Canned beans with added salt. Canned or smoked fish. Whole eggs or egg yolks. Chicken or [place] with skin. Dairy Whole or 2% milk, cream, and half-and-half. Whole or full-fat cream cheese. Whole-fat or sweetened yogurt. Full-fat cheese. Nondairy creamers. Whipped toppings. Processed cheese and cheese spreads. Fats and oils Butter.  Stick margarine. Lard. Shortening. Ghee. Bacon fat. Tropical oils, such as coconut, palm kernel, or palm oil. Seasoning and other foods Salted popcorn and pretzels. Onion salt, garlic salt, seasoned salt, table salt, and sea salt. Worcestershire sauce. Tartar sauce. Barbecue sauce. Teriyaki sauce. Soy sauce, including reduced-sodium. Steak sauce. Canned and packaged gravies. Fish sauce. Oyster sauce. Cocktail sauce. Horseradish that you find on the shelf. Ketchup. Mustard. Meat flavorings and tenderizers. Bouillon cubes. Hot sauce and Tabasco sauce. Premade or packaged marinades. Premade or packaged taco seasonings. Relishes. Regular salad dressings. Where to find more information: National Heart, Lung, and [place]: https://wilson-eaton.com/ American Heart Association: www.heart.org Summary The DASH eating plan is a healthy eating plan that has been shown to reduce high blood pressure (hypertension). It may also reduce your risk for type 2 diabetes, heart disease, and stroke. With the DASH eating plan, you should limit salt (sodium) intake to 2,300 mg a day. If you have hypertension, you may need to reduce your sodium intake to 1,500 mg a day. When on the DASH eating plan, aim to eat more fresh fruits and vegetables, whole grains, lean proteins, low-fat dairy, and heart-healthy fats. Work with your health care provider or diet and nutrition specialist (dietitian) to adjust your eating plan to your individual calorie needs. This information is not intended to replace advice given to you by your health care provider. Make sure you discuss any questions you have with your health care provider. Document Revised: 12/20/2016  Document Reviewed: 01/01/2016 Elsevier Patient Education  [employer]. . Please review the attached list of medications and notify my office if there are any errors.   . Please bring all of your medications to every appointment so we can make sure that our medication list is the  same as yours.

## 2019-03-31 NOTE — Progress Notes (Signed)
Patient: Brian Bishop Male    DOB: 01-19-1951   69 y.o.   MRN: [id number] Visit Date: 04/02/2019  Today's Provider: Lelon Huh, MD   Chief Complaint  Patient presents with  . Hypertension   Subjective:     HPI  Hypertension, follow-up:  BP Readings from Last 3 Encounters:  04/02/19 (!) 148/72  02/02/19 (!) 160/69  03/04/18 (!) 162/80    He was last seen for hypertension 2 months ago.  BP at that visit was 160/69. Management since that visit includes increased Amlodipine from 5mg  to 10mg  daily. He reports good compliance with treatment. He is not having side effects.  He is not exercising. He is adherent to low salt diet.   Outside blood pressures are not checked. He is experiencing lower extremity edema.  Patient denies chest pain, chest pressure/discomfort, claudication, dyspnea, exertional chest pressure/discomfort, fatigue, irregular heart beat, near-syncope, orthopnea, palpitations, paroxysmal nocturnal dyspnea, syncope and tachypnea.   Cardiovascular risk factors include advanced age (older than 59 for men, 71 for women), hypertension and male gender.  Use of agents associated with hypertension: none.     Weight trend: stable Wt Readings from Last 3 Encounters:  04/02/19 220 lb (99.8 kg)  02/02/19 220 lb 3.2 oz (99.9 kg)  03/04/18 215 lb (97.5 kg)    Current diet: in general, a "healthy" diet    ------------------------------------------------------------------------  Lipid/Cholesterol, Follow-up:   Last seen for this1 years ago.  Management changes since that visit include none; patient was advised to cut back on sweets and starches in his diet. . Last Lipid Panel:    Component Value Date/Time   CHOL 207 (H) 03/04/2018 1535   TRIG 263 (H) 03/04/2018 1535   HDL 47 03/04/2018 1535   CHOLHDL 4.4 03/04/2018 1535   LDLCALC 107 (H) 03/04/2018 1535    Risk factors for vascular disease include hypertension  He reports good compliance with  treatment. He is not having side effects.  Current symptoms include none and have been stable. Weight trend: stable Prior visit with dietician: no Current diet: in general, a "healthy" diet   Current exercise: none  Wt Readings from Last 3 Encounters:  04/02/19 220 lb (99.8 kg)  02/02/19 220 lb 3.2 oz (99.9 kg)  03/04/18 215 lb (97.5 kg)    -------------------------------------------------------------------  Allergies  Allergen Reactions  . Bee Venom Swelling and Other (See Comments)    "turned red"     Current Outpatient Medications:  .  amLODipine (NORVASC) 10 MG tablet, Take 1 tablet (10 mg total) by mouth daily., Disp: 30 tablet, Rfl: 3  Review of Systems  Constitutional: Negative for appetite change, chills and fever.  Respiratory: Negative for chest tightness, shortness of breath and wheezing.   Cardiovascular: Positive for leg swelling. Negative for chest pain and palpitations.  Gastrointestinal: Negative for abdominal pain, nausea and vomiting.    Social History   Tobacco Use  . Smoking status: Former Smoker    Packs/day: 1.00    Years: 20.00    Pack years: 20.00    Quit date: 2001    Years since quitting: 20.2  . Smokeless tobacco: Never Used  Substance Use Topics  . Alcohol use: Yes    Comment: drinks beer daily      Objective:   BP (!) 148/72 (BP Location: Right Arm, Cuff Size: Large)   Pulse 64   Temp (!) 97.3 F (36.3 C) (Temporal)   Resp 16   Wt 220  lb (99.8 kg)   SpO2 95% Comment: room air  BMI 30.68 kg/m  Vitals:   04/02/19 0802 04/02/19 0806  BP: 140/70 (!) 148/72  Pulse: 64   Resp: 16   Temp: (!) 97.3 F (36.3 C)   TempSrc: Temporal   SpO2: 95%   Weight: 220 lb (99.8 kg)   Body mass index is 30.68 kg/m.   Physical Exam  General appearance: Obese male, cooperative and in no acute distress Head: Normocephalic, without obvious abnormality, atraumatic Respiratory: Respirations even and unlabored, normal respiratory  rate Extremities: All extremities are intact.  Skin: Skin color, texture, turgor normal. No rashes seen  Psych: Appropriate mood and affect. Neurologic: Mental status: Alert, oriented to person, place, and time, thought content appropriate.       Assessment & Plan    1. Prostate cancer screening  - PSA  2. Essential (primary) hypertension Much better with increased dose of amlodipine. Continue current medications.   - Lipid panel - Comprehensive metabolic panel    The entirety of the information documented in the History of Present Illness, Review of Systems and Physical Exam were personally obtained by me. Portions of this information were initially documented by Meyer Cory, CMA and reviewed by me for thoroughness and accuracy.   Lelon Huh, MD  [facility]

## 2019-04-02 ENCOUNTER — Other Ambulatory Visit: Payer: Self-pay

## 2019-04-02 ENCOUNTER — Encounter: Payer: Self-pay | Admitting: Family Medicine

## 2019-04-02 ENCOUNTER — Ambulatory Visit (INDEPENDENT_AMBULATORY_CARE_PROVIDER_SITE_OTHER): Payer: Medicare Other | Admitting: Family Medicine

## 2019-04-02 VITALS — BP 148/72 | HR 64 | Temp 97.3°F | Resp 16 | Wt 220.0 lb

## 2019-04-02 DIAGNOSIS — I10 Essential (primary) hypertension: Secondary | ICD-10-CM | POA: Diagnosis not present

## 2019-04-02 DIAGNOSIS — Z125 Encounter for screening for malignant neoplasm of prostate: Secondary | ICD-10-CM | POA: Diagnosis not present

## 2019-04-03 LAB — COMPREHENSIVE METABOLIC PANEL
ALT: 33 IU/L (ref 0–44)
AST: 32 IU/L (ref 0–40)
Albumin/Globulin Ratio: 1.7 (ref 1.2–2.2)
Albumin: 4.5 g/dL (ref 3.8–4.8)
Alkaline Phosphatase: 79 IU/L (ref 39–117)
BUN/Creatinine Ratio: 9 — ABNORMAL LOW (ref 10–24)
BUN: 8 mg/dL (ref 8–27)
Bilirubin Total: 0.6 mg/dL (ref 0.0–1.2)
CO2: 19 mmol/L — ABNORMAL LOW (ref 20–29)
Calcium: 9.5 mg/dL (ref 8.6–10.2)
Chloride: 103 mmol/L (ref 96–106)
Creatinine, Ser: 0.89 mg/dL (ref 0.76–1.27)
GFR calc Af Amer: 103 mL/min/{1.73_m2} (ref >59)
GFR calc non Af Amer: 89 mL/min/{1.73_m2} (ref >59)
Globulin, Total: 2.7 g/dL (ref 1.5–4.5)
Glucose: 92 mg/dL (ref 65–99)
Potassium: 4.1 mmol/L (ref 3.5–5.2)
Sodium: 138 mmol/L (ref 134–144)
Total Protein: 7.2 g/dL (ref 6.0–8.5)

## 2019-04-03 LAB — LIPID PANEL
Chol/HDL Ratio: 3.7 ratio (ref 0.0–5.0)
Cholesterol, Total: 183 mg/dL (ref 100–199)
HDL: 49 mg/dL (ref >39)
LDL Chol Calc (NIH): 120 mg/dL — ABNORMAL HIGH (ref 0–99)
Triglycerides: 73 mg/dL (ref 0–149)
VLDL Cholesterol Cal: 14 mg/dL (ref 5–40)

## 2019-04-03 LAB — PSA: Prostate Specific Ag, Serum: 0.3 ng/mL (ref 0.0–4.0)

## 2019-04-28 ENCOUNTER — Other Ambulatory Visit: Payer: Self-pay | Admitting: Family Medicine

## 2019-04-28 DIAGNOSIS — I10 Essential (primary) hypertension: Secondary | ICD-10-CM

## 2019-04-28 NOTE — Telephone Encounter (Signed)
Requested Prescriptions  Pending Prescriptions Disp Refills  . amLODipine (NORVASC) 10 MG tablet [Pharmacy Med Name: AMLODIPINE BESYLATE 10 MG TAB] 90 tablet 1    Sig: TAKE 1 TABLET BY MOUTH EVERY DAY     Cardiovascular:  Calcium Channel Blockers Failed - 04/28/2019  2:18 AM      Failed - Last BP in normal range    BP Readings from Last 1 Encounters:  04/02/19 (!) 148/72         Passed - Valid encounter within last 6 months    Recent Outpatient Visits          3 weeks ago Prostate cancer screening   [facility] Birdie Sons, MD   2 months ago Essential (primary) hypertension   [facility] Birdie Sons, MD   1 year ago Prostate cancer screening   [facility] Birdie Sons, MD   3 years ago Annual physical exam   [facility] Birdie Sons, MD   3 years ago Essential (primary) hypertension   [facility] Caryn Section, Kirstie Peri, MD

## 2019-05-04 DIAGNOSIS — H1045 Other chronic allergic conjunctivitis: Secondary | ICD-10-CM | POA: Diagnosis not present

## 2019-05-04 DIAGNOSIS — H5213 Myopia, bilateral: Secondary | ICD-10-CM | POA: Diagnosis not present

## 2019-05-04 DIAGNOSIS — H11153 Pinguecula, bilateral: Secondary | ICD-10-CM | POA: Diagnosis not present

## 2019-05-04 DIAGNOSIS — H52223 Regular astigmatism, bilateral: Secondary | ICD-10-CM | POA: Diagnosis not present

## 2019-05-04 DIAGNOSIS — H524 Presbyopia: Secondary | ICD-10-CM | POA: Diagnosis not present

## 2019-05-04 DIAGNOSIS — H25013 Cortical age-related cataract, bilateral: Secondary | ICD-10-CM | POA: Diagnosis not present

## 2019-05-04 DIAGNOSIS — H18413 Arcus senilis, bilateral: Secondary | ICD-10-CM | POA: Diagnosis not present

## 2019-05-04 DIAGNOSIS — H40013 Open angle with borderline findings, low risk, bilateral: Secondary | ICD-10-CM | POA: Diagnosis not present

## 2019-05-04 DIAGNOSIS — H2513 Age-related nuclear cataract, bilateral: Secondary | ICD-10-CM | POA: Diagnosis not present

## 2019-05-17 DIAGNOSIS — M1712 Unilateral primary osteoarthritis, left knee: Secondary | ICD-10-CM | POA: Diagnosis not present

## 2019-05-17 DIAGNOSIS — M25562 Pain in left knee: Secondary | ICD-10-CM | POA: Diagnosis not present

## 2019-06-01 ENCOUNTER — Other Ambulatory Visit: Payer: Self-pay | Admitting: Physician Assistant

## 2019-06-01 DIAGNOSIS — M2392 Unspecified internal derangement of left knee: Secondary | ICD-10-CM

## 2019-06-14 ENCOUNTER — Other Ambulatory Visit: Payer: Self-pay

## 2019-06-14 ENCOUNTER — Ambulatory Visit
Admission: RE | Admit: 2019-06-14 | Discharge: 2019-06-14 | Disposition: A | Discharge status: Home / Self Care | Payer: Medicare Other | Source: Ambulatory Visit | Attending: Physician Assistant | Admitting: Physician Assistant

## 2019-06-14 DIAGNOSIS — M25462 Effusion, left knee: Secondary | ICD-10-CM | POA: Diagnosis not present

## 2019-06-14 DIAGNOSIS — M2392 Unspecified internal derangement of left knee: Secondary | ICD-10-CM

## 2019-06-14 DIAGNOSIS — S83242A Other tear of medial meniscus, current injury, left knee, initial encounter: Secondary | ICD-10-CM | POA: Diagnosis not present

## 2019-06-28 DIAGNOSIS — M2391 Unspecified internal derangement of right knee: Secondary | ICD-10-CM | POA: Diagnosis not present

## 2019-06-28 DIAGNOSIS — M25561 Pain in right knee: Secondary | ICD-10-CM | POA: Diagnosis not present

## 2019-07-22 ENCOUNTER — Encounter: Payer: Self-pay | Admitting: Family Medicine

## 2019-07-22 ENCOUNTER — Other Ambulatory Visit: Payer: Self-pay

## 2019-07-22 ENCOUNTER — Telehealth (INDEPENDENT_AMBULATORY_CARE_PROVIDER_SITE_OTHER): Payer: Medicare Other | Admitting: Family Medicine

## 2019-07-22 VITALS — Temp 98.2°F

## 2019-07-22 DIAGNOSIS — J301 Allergic rhinitis due to pollen: Secondary | ICD-10-CM
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
## 2019-08-03 DIAGNOSIS — M2392 Unspecified internal derangement of left knee: Secondary | ICD-10-CM | POA: Diagnosis not present

## 2019-10-08 ENCOUNTER — Telehealth: Payer: Self-pay | Admitting: Family Medicine

## 2019-10-08 NOTE — Telephone Encounter (Signed)
Copied from [place] [phone]. Topic: Medicare AWV >> Oct 08, 2019 10:51 AM Cher Nakai R wrote: Reason for CRM:  Left message for patient to call back and schedule Medicare Annual Wellness Visit (AWV) either virtually or in office.  No hx of AWV - Eligible as of 10/22/2018  Please schedule at anytime with [facility] Health Advisor.

## 2019-10-20 DIAGNOSIS — Z23 Encounter for immunization: Secondary | ICD-10-CM | POA: Diagnosis not present

## 2019-10-21 ENCOUNTER — Other Ambulatory Visit: Payer: Self-pay | Admitting: Family Medicine

## 2019-10-21 DIAGNOSIS — I10 Essential (primary) hypertension: Secondary | ICD-10-CM

## 2019-10-21 NOTE — Telephone Encounter (Signed)
Requested Prescriptions  Pending Prescriptions Disp Refills  . amLODipine (NORVASC) 10 MG tablet [Pharmacy Med Name: AMLODIPINE BESYLATE 10 MG TAB] 90 tablet 1    Sig: TAKE 1 TABLET BY MOUTH EVERY DAY     Cardiovascular:  Calcium Channel Blockers Failed - 10/21/2019  1:32 AM      Failed - Last BP in normal range    BP Readings from Last 1 Encounters:  04/02/19 (!) 148/72         Passed - Valid encounter within last 6 months    Recent Outpatient Visits          3 months ago Seasonal allergic rhinitis due to pollen   [place], [place], [place]   6 months ago Prostate cancer screening   [facility] Birdie Sons, MD   8 months ago Essential (primary) hypertension   [facility] Birdie Sons, MD   1 year ago Prostate cancer screening   [facility] Birdie Sons, MD   3 years ago Annual physical exam   [facility] Birdie Sons, MD

## 2020-02-09 ENCOUNTER — Telehealth: Payer: Self-pay | Admitting: Family Medicine

## 2020-02-09 NOTE — Telephone Encounter (Signed)
Copied from [place] [id number]. Topic: Medicare AWV >> Feb 09, 2020 11:31 AM Cher Nakai R wrote: Reason for CRM:   Left message for patient to call back and schedule Medicare Annual Wellness Visit (AWV) in office.   If not able to come in office, please offer to do virtually or by telephone.   No hx of AWV - AWV-I eligible as of 10/22/2018 awvi  Please schedule at anytime with [facility] Health Advisor.   40 minute appointment  Any questions, please contact me at [phone]

## 2020-04-03 ENCOUNTER — Telehealth: Payer: Self-pay | Admitting: Family Medicine

## 2020-04-03 NOTE — Telephone Encounter (Signed)
Copied from [place] [id number]. Topic: Medicare AWV >> Apr 03, 2020  2:33 PM Cher Nakai R wrote: Reason for CRM:  Left message for patient to call back and schedule Medicare Annual Wellness Visit (AWV) in office.   If not able to come in the office, please offer to do virtually or by telephone.   No hx of AWV - AWV-I eligible as of  10/22/2018  Please schedule at anytime with [facility] Health Advisor.   40 minute appointment  Any questions, please contact me at [phone]

## 2020-04-13 ENCOUNTER — Other Ambulatory Visit: Payer: Self-pay | Admitting: Family Medicine

## 2020-04-13 DIAGNOSIS — I10 Essential (primary) hypertension: Secondary | ICD-10-CM

## 2020-04-13 NOTE — Telephone Encounter (Signed)
Requested medications are due for refill today.  yes  Requested medications are on the active medications list.  yes  Last refill. 10/21/2019  Future visit scheduled.   no  Notes to clinic.  More than 3 months over due for OV.

## 2020-05-08 DIAGNOSIS — Z23 Encounter for immunization: Secondary | ICD-10-CM | POA: Diagnosis not present

## 2020-06-10 ENCOUNTER — Telehealth: Payer: Medicare Other | Admitting: Physician Assistant

## 2020-06-10 DIAGNOSIS — J069 Acute upper respiratory infection, unspecified: Secondary | ICD-10-CM | POA: Diagnosis not present

## 2020-06-10 MED ORDER — BENZONATATE 100 MG PO CAPS: 100.0000 mg | ORAL_CAPSULE | Freq: Three times a day (TID) | ORAL | 0 refills | Status: DC | PRN

## 2020-06-10 NOTE — Progress Notes (Signed)
Your current symptoms could be consistent with the coronavirus.  Many health care providers can now test patients at their office but not all are.  [place] has multiple testing sites. For information on our [place] testing locations and hours go to [email or web address]  We are enrolling you in our [place] for [place] . Daily you will receive a questionnaire within the [place] website. Our COVID 19 response team will be monitoring your responses daily.  Testing Information: The COVID-19 Community Testing sites are testing BY APPOINTMENT ONLY.  You can schedule online at [email or web address]  If you do not have access to a smart phone or computer you may call [phone] for an appointment.   Additional testing sites in the Community:  . For [facility] Testing sites in [facility]  FaceUpdate.[email or web address]  . For Pop-up testing sites in [place]  BowlDirectory.co.uk  . For [facility] [email or web address]  . For [facility] testing in [place] and [employer] [email or web address]  . For [facility] testing in [facility]   [email or web address]  For  more information about community testing call [phone]   Please quarantine yourself while awaiting your test results. Please stay home for a minimum of 10 days from the first day of illness with improving symptoms and you have had 24 hours of no fever (without the use of Tylenol (Acetaminophen) Motrin (Ibuprofen) or any fever reducing medication).  Also - Do not get tested prior to returning to work because once you have had a positive test the test can stay positive for more than a month in some  cases.   You should wear a mask or cloth face covering over your nose and mouth if you must be around other people or animals, including pets (even at home). Try to stay at least 6 feet away from other people. This will protect the people around you.  Please continue good preventive care measures, including:  frequent hand-washing, avoid touching your face, cover coughs/sneezes, stay out of crowds and keep a 6 foot distance from others.  COVID-19 is a respiratory illness with symptoms that are similar to the flu. Symptoms are typically mild to moderate, but there have been cases of severe illness and death due to the virus.   The following symptoms may appear 2-14 days after exposure: . Fever . Cough . Shortness of breath or difficulty breathing . Chills . Repeated shaking with chills . Muscle pain . Headache . Sore throat . New loss of taste or smell . Fatigue . Congestion or runny nose . Nausea or vomiting . Diarrhea  Go to the nearest hospital ED for assessment if fever/cough/breathlessness are severe or illness seems like a threat to life.  It is vitally important that if you feel that you have an infection such as this virus or any other virus that you stay home and away from places where you may spread it to others.  You should avoid contact with people age 68 and older.   You can use medication such as prescription cough medication called Tessalon Perles 100 mg. You may take 1-2 capsules every 8 hours as needed for cough  You may also take acetaminophen (Tylenol) as needed for fever.  Reduce your risk of any infection by using the same precautions used for avoiding the common cold or flu:  Marland Kitchen Wash your hands often with soap and warm water for at least 20 seconds.  If soap and water are not readily available, use an alcohol-based hand sanitizer  with at least 60% alcohol.  . If coughing or sneezing, cover your mouth and nose by coughing or sneezing into the elbow areas of your shirt or  coat, into a tissue or into your sleeve (not your hands). . Avoid shaking hands with others and consider head nods or verbal greetings only. . Avoid touching your eyes, nose, or mouth with unwashed hands.  . Avoid close contact with people who are sick. . Avoid places or events with large numbers of people in one location, like concerts or sporting events. . Carefully consider travel plans you have or are making. . If you are planning any travel outside or inside the [place], visit the CDC's Travelers' Health webpage for the latest health notices. . If you have some symptoms but not all symptoms, continue to monitor at home and seek medical attention if your symptoms worsen. . If you are having a medical emergency, call 911.  HOME CARE . Only take medications as instructed by your medical team. . Drink plenty of fluids and get plenty of rest. . A steam or ultrasonic humidifier can help if you have congestion.   GET HELP RIGHT AWAY IF YOU HAVE EMERGENCY WARNING SIGNS** FOR COVID-19. If you or someone is showing any of these signs seek emergency medical care immediately. Call 911 or proceed to your closest emergency facility if: . You develop worsening high fever. . Trouble breathing . Bluish lips or face . Persistent pain or pressure in the chest . New confusion . Inability to wake or stay awake . You cough up blood. . Your symptoms become more severe  **This list is not all possible symptoms. Contact your medical provider for any symptoms that are sever or concerning to you.  MAKE SURE YOU   Understand these instructions.  Will watch your condition.  Will get help right away if you are not doing well or get worse.  Your e-visit answers were reviewed by a board certified advanced clinical practitioner to complete your personal care plan.  Depending on the condition, your plan could have included both over the counter or prescription medications.  If there is a problem please reply once  you have received a response from your provider.  Your safety is important to [place].  If you have drug allergies check your prescription carefully.    You can use MyChart to ask questions about today's visit, request a non-urgent call back, or ask for a work or school excuse for 24 hours related to this e-Visit. If it has been greater than 24 hours you will need to follow up with your provider, or enter a new e-Visit to address those concerns. You will get an e-mail in the next two days asking about your experience.  I hope that your e-visit has been valuable and will speed your recovery. Thank you for using e-visits.   I provided 5 minutes of non face-to-face time during this encounter for chart review and documentation.

## 2020-06-13 ENCOUNTER — Telehealth: Payer: Medicare Other | Admitting: Physician Assistant

## 2020-06-13 DIAGNOSIS — R059 Cough, unspecified: Secondary | ICD-10-CM

## 2020-06-13 MED ORDER — PREDNISONE 20 MG PO TABS: 40.0000 mg | ORAL_TABLET | Freq: Every day | ORAL | 0 refills | Status: DC

## 2020-06-13 MED ORDER — DOXYCYCLINE HYCLATE 100 MG PO CAPS: 100.0000 mg | ORAL_CAPSULE | Freq: Two times a day (BID) | ORAL | 0 refills | Status: DC

## 2020-06-13 NOTE — Progress Notes (Signed)
We are sorry that you are not feeling well.  Here is how we plan to help!  Based on your presentation I believe you most likely have A cough due to bacteria.  When patients have a fever and a productive cough with a change in color or increased sputum production, we are concerned about bacterial bronchitis.  If left untreated it can progress to pneumonia.  If your symptoms do not improve with your treatment plan it is important that you contact your provider.   I have prescribed Doxycycline 100 mg twice a day for 7 days     In addition you may use the [employer] as prescribed.  Prednisone 40mg  x 5 days.  From your responses in the eVisit questionnaire you describe inflammation in the upper respiratory tract which is causing a significant cough.  This is commonly called Bronchitis and has four common causes:    Allergies  Viral Infections  Acid Reflux  Bacterial Infection Allergies, viruses and acid reflux are treated by controlling symptoms or eliminating the cause. An example might be a cough caused by taking certain blood pressure medications. You stop the cough by changing the medication. Another example might be a cough caused by acid reflux. Controlling the reflux helps control the cough.  USE OF BRONCHODILATOR ("RESCUE") INHALERS: There is a risk from using your bronchodilator too frequently.  The risk is that over-reliance on a medication which only relaxes the muscles surrounding the breathing tubes can reduce the effectiveness of medications prescribed to reduce swelling and congestion of the tubes themselves.  Although you feel brief relief from the bronchodilator inhaler, your asthma may actually be worsening with the tubes becoming more swollen and filled with mucus.  This can delay other crucial treatments, such as oral steroid medications. If you need to use a bronchodilator inhaler daily, several times per day, you should discuss this with your provider.  There are probably  better treatments that could be used to keep your asthma under control.     HOME CARE . Only take medications as instructed by your medical team. . Complete the entire course of an antibiotic. . Drink plenty of fluids and get plenty of rest. . Avoid close contacts especially the very young and the elderly . Cover your mouth if you cough or cough into your sleeve. . Always remember to wash your hands . A steam or ultrasonic humidifier can help congestion.   GET HELP RIGHT AWAY IF: . You develop worsening fever. . You become short of breath . You cough up blood. . Your symptoms persist after you have completed your treatment plan MAKE SURE YOU   Understand these instructions.  Will watch your condition.  Will get help right away if you are not doing well or get worse.  Your e-visit answers were reviewed by a board certified advanced clinical practitioner to complete your personal care plan.  Depending on the condition, your plan could have included both over the counter or prescription medications. If there is a problem please reply  once you have received a response from your provider. Your safety is important to [place].  If you have drug allergies check your prescription carefully.    You can use MyChart to ask questions about today's visit, request a non-urgent call back, or ask for a work or school excuse for 24 hours related to this e-Visit. If it has been greater than 24 hours you will need to follow up with your provider, or enter a  new e-Visit to address those concerns. You will get an e-mail in the next two days asking about your experience.  I hope that your e-visit has been valuable and will speed your recovery. Thank you for using e-visits.  Greater than 5 minutes, yet less than 10 minutes of time have been spent researching, coordinating, and implementing care for this patient today

## 2020-09-20 ENCOUNTER — Telehealth: Payer: Self-pay

## 2020-09-20 ENCOUNTER — Other Ambulatory Visit: Payer: Self-pay | Admitting: Family Medicine

## 2020-09-20 DIAGNOSIS — U071 COVID-19: Secondary | ICD-10-CM | POA: Insufficient documentation

## 2020-09-20 MED ORDER — GUAIFENESIN-DM 100-10 MG/5ML PO SYRP: 10.0000 mL | ORAL_SOLUTION | ORAL | 0 refills | Status: DC | PRN

## 2020-09-20 MED ORDER — SALINE SPRAY 0.65 % NA SOLN
1.0000 | NASAL | 0 refills | Status: DC | PRN
Start: 2020-09-20 — End: 2021-05-01

## 2020-09-20 MED ORDER — FLUTICASONE PROPIONATE 50 MCG/ACT NA SUSP: 2.0000 | Freq: Every day | NASAL | 6 refills | Status: DC

## 2020-09-20 MED ORDER — NIRMATRELVIR/RITONAVIR (PAXLOVID)TABLET: 3.0000 | ORAL_TABLET | Freq: Two times a day (BID) | ORAL | 0 refills | Status: AC

## 2020-09-20 MED ORDER — CETIRIZINE HCL 10 MG PO TABS: 10.0000 mg | ORAL_TABLET | Freq: Every day | ORAL | 11 refills | Status: DC

## 2020-09-20 NOTE — Telephone Encounter (Signed)
Called and spoke with patient on the phone he states that symptoms began on Monday 09/18/20 with sinus pain/pressure, congestion, cough runny nose. Patient states that he has been taking otc Allegra D to treat symptoms. Patient denies fever, G.I upset , headache or myalgia Patient reported today that he felt more fatigued than usual. [facility] [place] Patient last GFR was 89 on 04/02/2019

## 2020-09-20 NOTE — Telephone Encounter (Signed)
Copied from [place] [phone]. Topic: General - Other >> Sep 20, 2020  4:01 PM Pawlus, Brayton Layman A wrote: Reason for CRM: Pt called in stating he just took a rapid covid test today and was positive. Pt wanted to let Dr Caryn Section know and see what he recommends the pt to do.

## 2020-09-26 ENCOUNTER — Ambulatory Visit: Payer: Medicare Other | Admitting: Family Medicine

## 2020-10-08 ENCOUNTER — Other Ambulatory Visit: Payer: Self-pay | Admitting: Family Medicine

## 2020-10-08 DIAGNOSIS — I10 Essential (primary) hypertension: Secondary | ICD-10-CM

## 2020-11-21 DIAGNOSIS — X32XXXA Exposure to sunlight, initial encounter: Secondary | ICD-10-CM | POA: Diagnosis not present

## 2020-11-21 DIAGNOSIS — L821 Other seborrheic keratosis: Secondary | ICD-10-CM | POA: Diagnosis not present

## 2020-11-21 DIAGNOSIS — D225 Melanocytic nevi of trunk: Secondary | ICD-10-CM | POA: Diagnosis not present

## 2020-11-21 DIAGNOSIS — M7130 Other bursal cyst, unspecified site: Secondary | ICD-10-CM | POA: Diagnosis not present

## 2020-11-21 DIAGNOSIS — L570 Actinic keratosis: Secondary | ICD-10-CM | POA: Diagnosis not present

## 2020-11-21 DIAGNOSIS — L814 Other melanin hyperpigmentation: Secondary | ICD-10-CM | POA: Diagnosis not present

## 2020-11-21 DIAGNOSIS — Z7189 Other specified counseling: Secondary | ICD-10-CM | POA: Diagnosis not present

## 2020-11-21 DIAGNOSIS — L448 Other specified papulosquamous disorders: Secondary | ICD-10-CM | POA: Diagnosis not present

## 2021-01-03 ENCOUNTER — Other Ambulatory Visit: Payer: Self-pay | Admitting: Family Medicine

## 2021-01-03 DIAGNOSIS — I10 Essential (primary) hypertension: Secondary | ICD-10-CM

## 2021-01-03 NOTE — Telephone Encounter (Signed)
Requested medications are due for refill today.  yes  Requested medications are on the active medications list.  yes  Last refill. 10/09/2020  Future visit scheduled.   no  Notes to clinic.  Last seen 07/22/2019. Pt is more than 3 months overdue for an office visit.    Requested Prescriptions  Pending Prescriptions Disp Refills   amLODipine (NORVASC) 10 MG tablet [Pharmacy Med Name: AMLODIPINE BESYLATE 10 MG TAB] 90 tablet 0    Sig: TAKE 1 TABLET BY MOUTH EVERY DAY     Cardiovascular:  Calcium Channel Blockers Failed - 01/03/2021  1:44 AM      Failed - Last BP in normal range    BP Readings from Last 1 Encounters:  04/02/19 (!) 148/72          Failed - Valid encounter within last 6 months    Recent Outpatient Visits           1 year ago Seasonal allergic rhinitis due to pollen   [place], Vickki Muff, PA-C   1 year ago Prostate cancer screening   [facility] Birdie Sons, MD   1 year ago Essential (primary) hypertension   [facility] Birdie Sons, MD   2 years ago Prostate cancer screening   [facility] Birdie Sons, MD   4 years ago Annual physical exam   [facility] Birdie Sons, MD

## 2021-01-31 ENCOUNTER — Other Ambulatory Visit: Payer: Self-pay | Admitting: Family Medicine

## 2021-01-31 DIAGNOSIS — I10 Essential (primary) hypertension: Secondary | ICD-10-CM

## 2021-01-31 NOTE — Telephone Encounter (Signed)
Requested medication (s) are due for refill today:   Yes  Requested medication (s) are on the active medication list:   Yes  Future visit scheduled:   No  Left voicemail for pt to call and make an appt.  He cancelled last appt 09/26/2020.   Last ordered: Courtesy refill has been given last month.    Provider to review for further refills.   Requested Prescriptions  Pending Prescriptions Disp Refills   amLODipine (NORVASC) 10 MG tablet [Pharmacy Med Name: AMLODIPINE BESYLATE 10 MG TAB] 30 tablet 0    Sig: TAKE 1 TABLET BY MOUTH EVERY DAY     Cardiovascular:  Calcium Channel Blockers Failed - 01/31/2021  8:31 AM      Failed - Last BP in normal range    BP Readings from Last 1 Encounters:  04/02/19 (!) 148/72          Failed - Valid encounter within last 6 months    Recent Outpatient Visits           1 year ago Seasonal allergic rhinitis due to pollen   [facility] [place], Vickki Muff, PA-C   1 year ago Prostate cancer screening   [facility] Birdie Sons, MD   1 year ago Essential (primary) hypertension   [facility] Birdie Sons, MD   2 years ago Prostate cancer screening   [facility] Birdie Sons, MD   5 years ago Annual physical exam   [facility] Birdie Sons, MD

## 2021-02-20 IMAGING — MR MR KNEE*L* W/O CM
6 series · 40 of 40 positions shown · non-contrast
Comparison: None.

CLINICAL DATA: Medial knee pain for the past 2 months. No injury or
prior surgery.

EXAM:
MRI OF THE LEFT KNEE WITHOUT CONTRAST
TECHNIQUE: Multiplanar, multisequence MR imaging of the knee was performed. No
intravenous contrast was administered.

[Series 8: T2 fat-sat · axial · left · 4.0mm · 0.50mm/px · z∈[-93,+31]mm · 7 of 26 slices shown (1 of 3)]
[im 1/26]
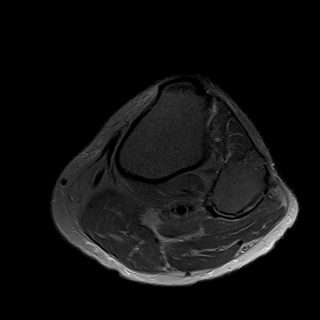
[im 5/26]
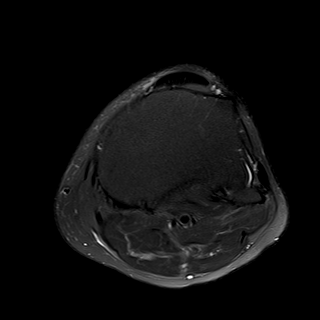
[im 9/26]
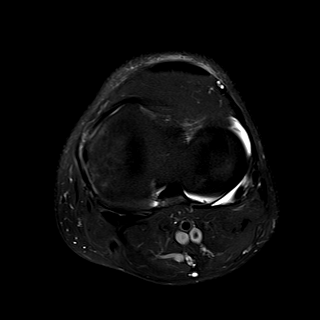
[im 13/26]
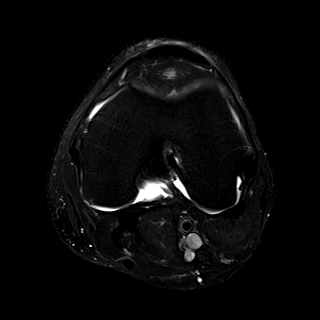
[im 17/26]
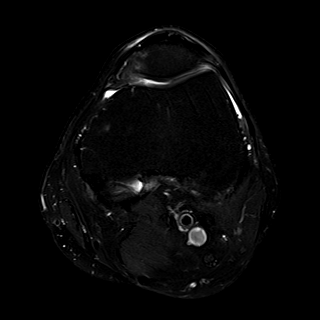
[im 21/26]
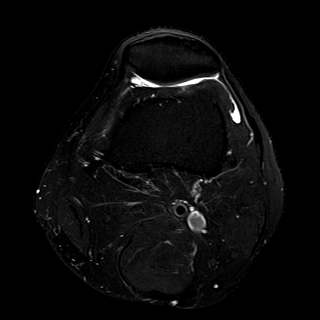
[im 26/26]
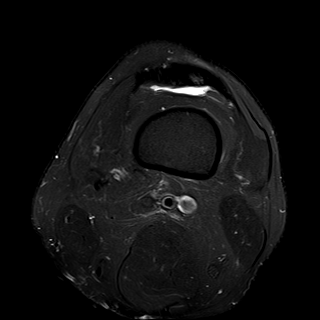

[Series 9: T2 fat-sat · coronal · left · 4.0mm · 0.59mm/px · 6 of 24 slices shown (2 of 3)]
[im 1/24]
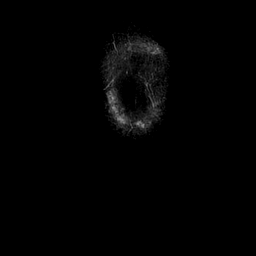
[im 5/24]
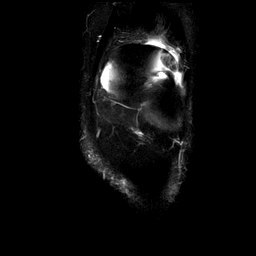
[im 10/24]
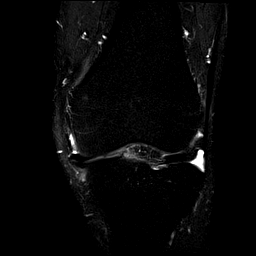
[im 14/24]
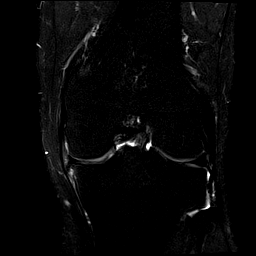
[im 19/24]
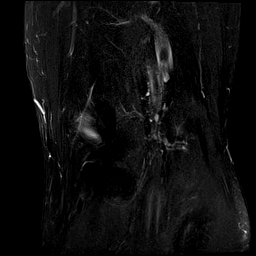
[im 24/24]
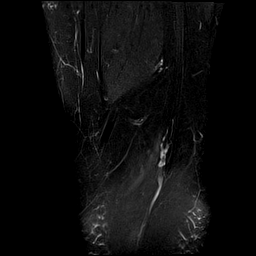

[Series 10: T1 · coronal · left · 4.0mm · 0.59mm/px · 6 of 24 slices shown]
[im 1/24]
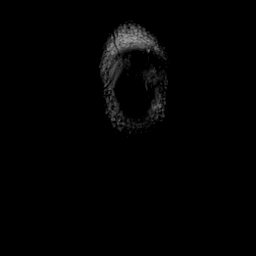
[im 5/24]
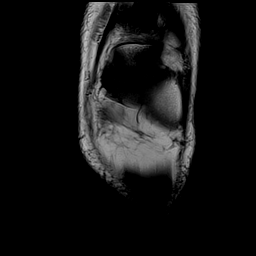
[im 10/24]
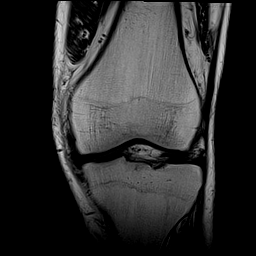
[im 14/24]
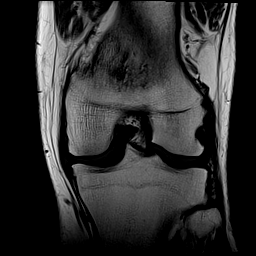
[im 19/24]
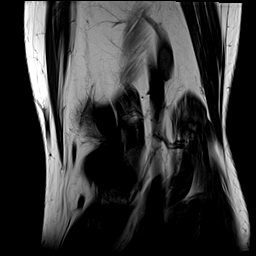
[im 24/24]
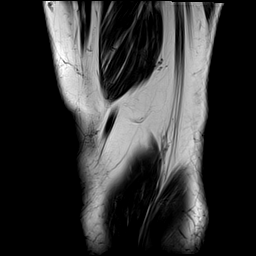

[Series 11: PD fat-sat · coronal · left · 4.0mm · 0.59mm/px · 6 of 24 slices shown (1 of 2)]
[im 1/24]
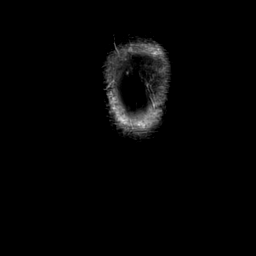
[im 5/24]
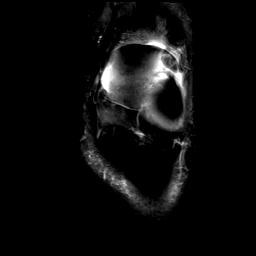
[im 10/24]
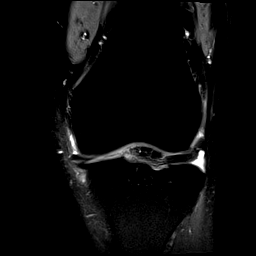
[im 14/24]
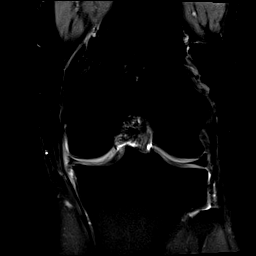
[im 19/24]
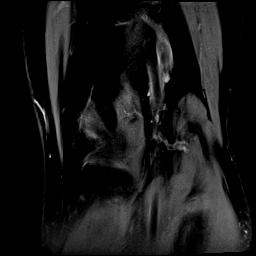
[im 24/24]
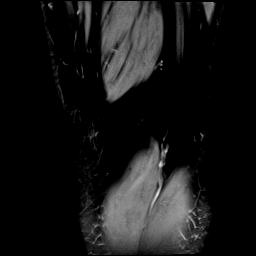

[Series 12: PD fat-sat · sagittal · left · 3.0mm · 0.59mm/px · 7 of 29 slices shown (2 of 2)]
[im 1/29]
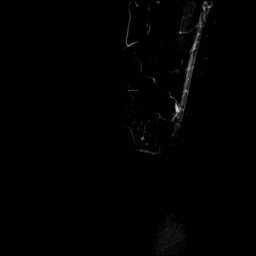
[im 5/29]
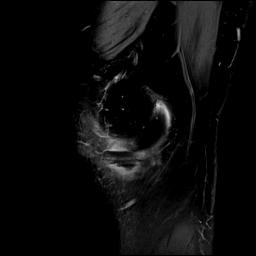
[im 10/29]
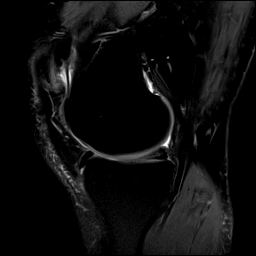
[im 15/29]
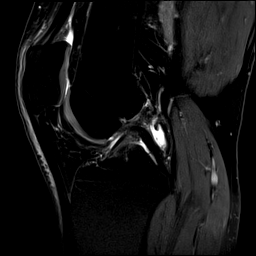
[im 19/29]
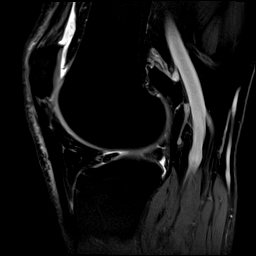
[im 24/29]
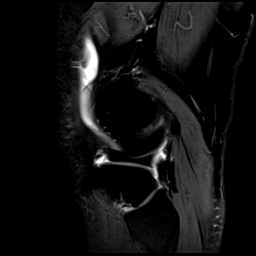
[im 29/29]
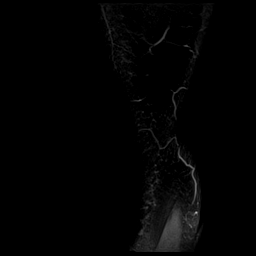

[Series 13: T2 fat-sat · sagittal · left · 3.0mm · 0.59mm/px · 8 of 30 slices shown (3 of 3)]
[im 1/30]
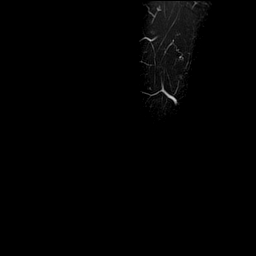
[im 5/30]
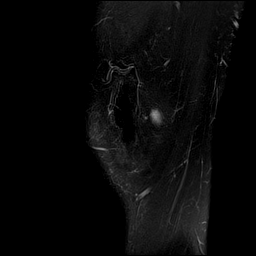
[im 9/30]
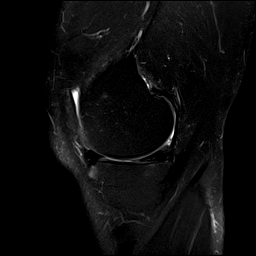
[im 13/30]
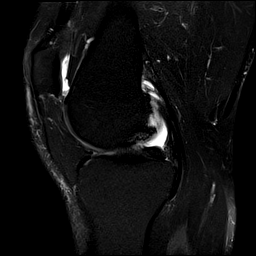
[im 17/30]
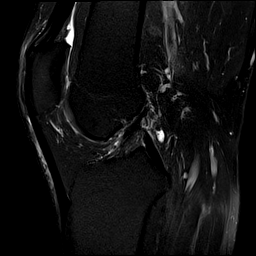
[im 21/30]
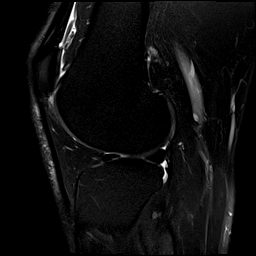
[im 25/30]
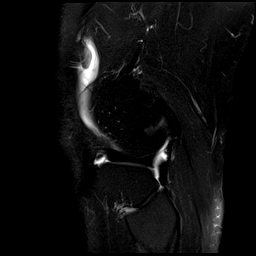
[im 30/30]
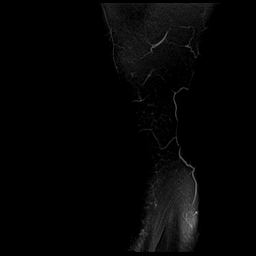

[40 of 40 positions shown; findings below may reference images not displayed]

FINDINGS: MENISCI

Medial meniscus: Horizontal tear of the posterior horn and body
extending to the body/anterior horn junction. Borderline extrusion
of the body.

Lateral meniscus:  Intact.

LIGAMENTS

Cruciates:  Intact ACL and PCL.

Collaterals: Medial collateral ligament is intact. Lateral
collateral ligament complex is intact.

CARTILAGE

Patellofemoral:  No chondral defect.

Medial: Mild partial-thickness cartilage loss. More focal high-grade
near full-thickness cartilage loss over the posterior
nonweightbearing medial femoral condyle.

Lateral:  No chondral defect.

Joint: Small joint effusion. Normal Hoffa's fat. No plical
thickening.

Popliteal Fossa:  No Baker cyst. Intact popliteus tendon.

Extensor Mechanism: Intact quadriceps tendon and patellar tendon.
Intact medial and lateral patellar retinaculum. Intact MPFL.

Bones: No acute fracture or dislocation. No suspicious bone lesion.
Small medial compartment marginal osteophytes.

Other: None.
IMPRESSION: 1. Horizontal tear of the medial meniscus posterior horn and body
extending to the body/anterior horn junction.
2. Mild medial compartment osteoarthritis.
3. Small joint effusion.

## 2021-05-01 ENCOUNTER — Encounter: Payer: Self-pay | Admitting: Physician Assistant

## 2021-05-01 ENCOUNTER — Ambulatory Visit (INDEPENDENT_AMBULATORY_CARE_PROVIDER_SITE_OTHER): Payer: Medicare Other | Admitting: Physician Assistant

## 2021-05-01 VITALS — BP 133/60 | HR 65 | Temp 98.2°F | Resp 14 | Ht 72.0 in | Wt 218.0 lb

## 2021-05-01 DIAGNOSIS — Z125 Encounter for screening for malignant neoplasm of prostate: Secondary | ICD-10-CM | POA: Diagnosis not present

## 2021-05-01 DIAGNOSIS — Z23 Encounter for immunization: Secondary | ICD-10-CM

## 2021-05-01 DIAGNOSIS — E049 Nontoxic goiter, unspecified: Secondary | ICD-10-CM

## 2021-05-01 DIAGNOSIS — I10 Essential (primary) hypertension: Secondary | ICD-10-CM | POA: Diagnosis not present

## 2021-05-01 MED ORDER — LISINOPRIL 5 MG PO TABS: 5.0000 mg | ORAL_TABLET | Freq: Every day | ORAL | 3 refills | Status: DC

## 2021-05-01 MED ORDER — AMLODIPINE BESYLATE 10 MG PO TABS: 10.0000 mg | ORAL_TABLET | Freq: Every day | ORAL | 0 refills | Status: DC

## 2021-05-01 MED ORDER — TETANUS-DIPHTH-ACELL PERTUSSIS 5-2.5-18.5 LF-MCG/0.5 IM SUSY: 0.5000 mL | PREFILLED_SYRINGE | Freq: Once | INTRAMUSCULAR | 0 refills | Status: AC

## 2021-05-01 MED ORDER — LISINOPRIL 5 MG PO TABS: 5.0000 mg | ORAL_TABLET | Freq: Every day | ORAL | 0 refills | Status: DC

## 2021-05-01 NOTE — Progress Notes (Signed)
?  ? ? ?I,Brian Bishop,acting as a Education administrator for [employer], PA-C.,have documented all relevant documentation on the behalf of Brian Speak, PA-C,as directed by  [employer], PA-C while in the presence of [employer], PA-C.  ?Established patient visit ? ? ?Patient: Brian Bishop   DOB: 1950-04-25   71 y.o. Male  MRN: [id number] ?Visit Date: 05/01/2021 ? ?Today's healthcare provider: [name], PA-C  ? ?Chief Complaint  ?Patient presents with  ? Hypertension  ? ?Subjective  ?  ?HPI  ?Hypertension, follow-up ? ?BP Readings from Last 3 Encounters:  ?05/01/21 133/60  ?04/02/19 (!) 148/72  ?02/02/19 (!) 160/69  ? Wt Readings from Last 3 Encounters:  ?05/01/21 218 lb (98.9 kg)  ?04/02/19 220 lb (99.8 kg)  ?02/02/19 220 lb 3.2 oz (99.9 kg)  ?  ? ?He was last seen for hypertension on 04/02/2019.   ?BP at that visit was 148/72. Management since that visit includes continue same medications. ? ?He reports good compliance with treatment. ?He is not having side effects.  ?He is following a Regular diet. ?He is not exercising. ?He does not smoke. ? ?Use of agents associated with hypertension: none.  ? ?Outside blood pressures are not checked. ?Symptoms: ?No chest pain No chest pressure  ?No palpitations No syncope  ?No dyspnea No orthopnea  ?No paroxysmal nocturnal dyspnea Yes lower extremity edema  ? ?Pertinent labs ?Lab Results  ?Component Value Date  ? CHOL 183 04/02/2019  ? HDL 49 04/02/2019  ? LDLCALC 120 (H) 04/02/2019  ? TRIG 73 04/02/2019  ? CHOLHDL 3.7 04/02/2019  ? Lab Results  ?Component Value Date  ? NA 138 04/02/2019  ? K 4.1 04/02/2019  ? CREATININE 0.89 04/02/2019  ? GFRNONAA 89 04/02/2019  ? GLUCOSE 92 04/02/2019  ?  ? ?The 10-year ASCVD risk score (Arnett DK, et al., 2019) is: 18.7% ? ?---------------------------------------------------------------------------------------------------  ? ?Medications: ?Outpatient Medications Prior to Visit  ?Medication Sig  ? amLODipine (NORVASC) 10 MG tablet TAKE 1  TABLET BY MOUTH EVERY DAY  ? [DISCONTINUED] benzonatate (TESSALON) 100 MG capsule Take 1 capsule (100 mg total) by mouth 3 (three) times daily as needed. (Patient not taking: Reported on 05/01/2021)  ? [DISCONTINUED] cetirizine (ZYRTEC) 10 MG tablet Take 1 tablet (10 mg total) by mouth daily. (Patient not taking: Reported on 05/01/2021)  ? [DISCONTINUED] doxycycline (VIBRAMYCIN) 100 MG capsule Take 1 capsule (100 mg total) by mouth 2 (two) times daily.  ? [DISCONTINUED] fluticasone (FLONASE) 50 MCG/ACT nasal spray Place 2 sprays into both nostrils daily. (Patient not taking: Reported on 05/01/2021)  ? [DISCONTINUED] guaiFENesin-dextromethorphan (ROBITUSSIN DM) 100-10 MG/5ML syrup Take 10 mLs by mouth every 4 (four) hours as needed for cough.  ? [DISCONTINUED] predniSONE (DELTASONE) 20 MG tablet Take 2 tablets (40 mg total) by mouth daily.  ? [DISCONTINUED] sodium chloride (OCEAN) 0.65 % SOLN nasal spray Place 1 spray into both nostrils as needed for congestion.  ? ?No facility-administered medications prior to visit.  ? ? ?Review of Systems  ?Constitutional:  Negative for appetite change, chills and fever.  ?Respiratory:  Negative for chest tightness, shortness of breath and wheezing.   ?Cardiovascular:  Negative for chest pain and palpitations.  ?Gastrointestinal:  Negative for abdominal pain, nausea and vomiting.  ?Musculoskeletal:  Positive for joint swelling (ankle swelling).  ? ?  Objective  ?  ?BP 133/60 (BP Location: Right Arm, Patient Position: Sitting, Cuff Size: Large)   Pulse 65   Temp 98.2 ?F (36.8 ?C) (Oral)  Resp 14   Ht 6' (1.829 m)   Wt 218 lb (98.9 kg)   SpO2 98% Comment: room air  BMI 29.57 kg/m?  ? ? ?Physical Exam ?Vitals and nursing note reviewed.  ?Constitutional:   ?   Appearance: Normal appearance.  ?HENT:  ?   Head: Normocephalic and atraumatic.  ?Neck:  ?   Comments: Thyromegaly  ?Cardiovascular:  ?   Rate and Rhythm: Normal rate and regular rhythm.  ?   Pulses: Normal pulses.  ?    Heart sounds: Normal heart sounds.  ?Pulmonary:  ?   Effort: Pulmonary effort is normal.  ?Abdominal:  ?   General: Abdomen is flat. Bowel sounds are normal.  ?   Palpations: Abdomen is soft.  ?Musculoskeletal:  ?   Cervical back: Normal range of motion. No tenderness.  ?   Right lower leg: Edema (ankle, mild) present.  ?   Left lower leg: Edema (ankle, mild) present.  ?Lymphadenopathy:  ?   Cervical: No cervical adenopathy.  ?Neurological:  ?   General: No focal deficit present.  ?   Mental Status: He is alert and oriented to person, place, and time.  ?Psychiatric:     ?   Behavior: Behavior normal.     ?   Thought Content: Thought content normal.     ?   Judgment: Judgment normal.  ?  ? ? ?No results found for any visits on 05/01/21. ? Assessment & Plan  ?  ? ?1. Prescription for Tdap. Vaccine not administered in office.  ? ?- Tdap (BOOSTRIX) 5-2.5-18.5 LF-MCG/0.5 injection; Inject 0.5 mLs into the muscle once for 1 dose.  Dispense: 0.5 mL; Refill: 0 ? ?2. Essential (primary) hypertension ?BP today 133/60 ?- Comprehensive Metabolic Panel (CMET) ?- Lipid panel ?- amLODipine (NORVASC) 10 MG tablet; Take 1 tablet (10 mg total) by mouth daily.  Dispense: 30 tablet; Refill: 0 ?Take a half tablet daily. Could see if it will decrease ankle swelling and control BP ?- CBC with Differential/Platelet ?- lisinopril (ZESTRIL) 5 MG tablet; Take 1 tablet (5 mg total) by mouth daily.  Dispense: 30 tablet; Refill: 0 ? ?3. Prostate cancer screening ?- PSA Total (Reflex To Free) ?- Last PSA was 0.3 from 04/02/19 ? ?4. Thyroid enlargement ?Asymptomatic ?- US THYROID; Future ?- TSH + free T4 ? ?   ?The patient was advised to call back or seek an in-person evaluation if the symptoms worsen or if the condition fails to improve as anticipated. ? ?I discussed the assessment and treatment plan with the patient. The patient was provided an opportunity to ask questions and all were answered. The patient agreed with the plan and demonstrated  an understanding of the instructions. ? ?The entirety of the information documented in the History of Present Illness, Review of Systems and Physical Exam were personally obtained by me. Portions of this information were initially documented by the CMA and reviewed by me for thoroughness and accuracy.   ? ? ?Brian Speak, PA-C  ?[place] ?[phone] (phone) ?[phone] (fax) ? ?[facility]  ?

## 2021-05-02 LAB — CBC WITH DIFFERENTIAL/PLATELET
Basophils Absolute: 0 10*3/uL (ref 0.0–0.2)
Basos: 0 %
EOS (ABSOLUTE): 0.1 10*3/uL (ref 0.0–0.4)
Eos: 2 %
Hematocrit: 49.1 % (ref 37.5–51.0)
Hemoglobin: 16.6 g/dL (ref 13.0–17.7)
Immature Grans (Abs): 0 10*3/uL (ref 0.0–0.1)
Immature Granulocytes: 1 %
Lymphocytes Absolute: 1.5 10*3/uL (ref 0.7–3.1)
Lymphs: 20 %
MCH: 32.2 pg (ref 26.6–33.0)
MCHC: 33.8 g/dL (ref 31.5–35.7)
MCV: 95 fL (ref 79–97)
Monocytes Absolute: 0.7 10*3/uL (ref 0.1–0.9)
Monocytes: 10 %
Neutrophils Absolute: 4.7 10*3/uL (ref 1.4–7.0)
Neutrophils: 67 %
Platelets: 228 10*3/uL (ref 150–450)
RBC: 5.15 x10E6/uL (ref 4.14–5.80)
RDW: 12.6 % (ref 11.6–15.4)
WBC: 7.1 10*3/uL (ref 3.4–10.8)

## 2021-05-02 LAB — COMPREHENSIVE METABOLIC PANEL
ALT: 32 IU/L (ref 0–44)
AST: 28 IU/L (ref 0–40)
Albumin/Globulin Ratio: 2.5 — ABNORMAL HIGH (ref 1.2–2.2)
Albumin: 5 g/dL — ABNORMAL HIGH (ref 3.8–4.8)
Alkaline Phosphatase: 80 IU/L (ref 44–121)
BUN/Creatinine Ratio: 8 — ABNORMAL LOW (ref 10–24)
BUN: 8 mg/dL (ref 8–27)
Bilirubin Total: 0.6 mg/dL (ref 0.0–1.2)
CO2: 24 mmol/L (ref 20–29)
Calcium: 10.1 mg/dL (ref 8.6–10.2)
Chloride: 101 mmol/L (ref 96–106)
Creatinine, Ser: 0.99 mg/dL (ref 0.76–1.27)
Globulin, Total: 2 g/dL (ref 1.5–4.5)
Glucose: 84 mg/dL (ref 70–99)
Potassium: 4.4 mmol/L (ref 3.5–5.2)
Sodium: 140 mmol/L (ref 134–144)
Total Protein: 7 g/dL (ref 6.0–8.5)
eGFR: 83 mL/min/{1.73_m2} (ref >59)

## 2021-05-02 LAB — PSA TOTAL (REFLEX TO FREE): Prostate Specific Ag, Serum: 0.4 ng/mL (ref 0.0–4.0)

## 2021-05-02 LAB — LIPID PANEL
Chol/HDL Ratio: 4.4 ratio (ref 0.0–5.0)
Cholesterol, Total: 216 mg/dL — ABNORMAL HIGH (ref 100–199)
HDL: 49 mg/dL (ref >39)
LDL Chol Calc (NIH): 140 mg/dL — ABNORMAL HIGH (ref 0–99)
Triglycerides: 152 mg/dL — ABNORMAL HIGH (ref 0–149)
VLDL Cholesterol Cal: 27 mg/dL (ref 5–40)

## 2021-05-02 LAB — TSH+FREE T4
Free T4: 1.08 ng/dL (ref 0.82–1.77)
TSH: 2.07 u[IU]/mL (ref 0.450–4.500)

## 2021-05-04 ENCOUNTER — Ambulatory Visit
Admission: RE | Admit: 2021-05-04 | Discharge: 2021-05-04 | Disposition: A | Discharge status: Home / Self Care | Payer: Medicare Other | Source: Ambulatory Visit | Attending: Physician Assistant | Admitting: Physician Assistant

## 2021-05-04 DIAGNOSIS — E049 Nontoxic goiter, unspecified: Secondary | ICD-10-CM | POA: Insufficient documentation

## 2021-05-25 ENCOUNTER — Other Ambulatory Visit: Payer: Self-pay | Admitting: Physician Assistant

## 2021-05-25 DIAGNOSIS — I10 Essential (primary) hypertension: Secondary | ICD-10-CM

## 2021-05-25 NOTE — Telephone Encounter (Signed)
Requested medication (s) are due for refill today:   Yes ? ?Requested medication (s) are on the active medication list:   Yes ? ?Future visit scheduled:   Yes 05/31/2021 with Letitia Libra ? ? ?Last ordered: 05/01/2021 #30, 0 refills ? ?Returned because courtesy refill has been given in April.     ? ?Requested Prescriptions  ?Pending Prescriptions Disp Refills  ? amLODipine (NORVASC) 10 MG tablet [Pharmacy Med Name: AMLODIPINE BESYLATE 10 MG TAB] 30 tablet 0  ?  Sig: TAKE 1 TABLET BY MOUTH EVERY DAY  ?  ? Cardiovascular: Calcium Channel Blockers 2 Passed - 05/25/2021 12:31 PM  ?  ?  Passed - Last BP in normal range  ?  BP Readings from Last 1 Encounters:  ?05/01/21 133/60  ?  ?  ?  ?  Passed - Last Heart Rate in normal range  ?  Pulse Readings from Last 1 Encounters:  ?05/01/21 65  ?  ?  ?  ?  Passed - Valid encounter within last 6 months  ?  Recent Outpatient Visits   ? ?      ? 3 weeks ago Prescription for Tdap. Vaccine not administered in office.   ? [facility] [place], [place], [place]  ? 1 year ago Seasonal allergic rhinitis due to pollen  ? [place], PA-C  ? 2 years ago Prostate cancer screening  ? [facility] Caryn Section, Kirstie Peri, MD  ? 2 years ago Essential (primary) hypertension  ? [facility] Caryn Section, Kirstie Peri, MD  ? 3 years ago Prostate cancer screening  ? [facility] Caryn Section, Kirstie Peri, MD  ? ?  ?  ?Future Appointments   ? ?        ? In 6 days [name], PA-C [employer], [place]  ? ?  ? ? ?  ?  ?  ? ?

## 2021-05-31 ENCOUNTER — Ambulatory Visit (INDEPENDENT_AMBULATORY_CARE_PROVIDER_SITE_OTHER): Payer: Medicare Other | Admitting: Physician Assistant

## 2021-05-31 ENCOUNTER — Encounter: Payer: Self-pay | Admitting: Physician Assistant

## 2021-05-31 ENCOUNTER — Ambulatory Visit (INDEPENDENT_AMBULATORY_CARE_PROVIDER_SITE_OTHER): Payer: Medicare Other

## 2021-05-31 DIAGNOSIS — I10 Essential (primary) hypertension: Secondary | ICD-10-CM | POA: Diagnosis not present

## 2021-05-31 DIAGNOSIS — Z0000 Encounter for general adult medical examination without abnormal findings: Secondary | ICD-10-CM | POA: Diagnosis not present

## 2021-05-31 DIAGNOSIS — E782 Mixed hyperlipidemia: Secondary | ICD-10-CM | POA: Diagnosis not present

## 2021-05-31 MED ORDER — LISINOPRIL 5 MG PO TABS: 5.0000 mg | ORAL_TABLET | Freq: Every day | ORAL | 0 refills | Status: DC

## 2021-05-31 MED ORDER — ATORVASTATIN CALCIUM 10 MG PO TABS: 10.0000 mg | ORAL_TABLET | Freq: Every day | ORAL | 3 refills | Status: DC

## 2021-05-31 MED ORDER — AMLODIPINE BESYLATE 10 MG PO TABS: 10.0000 mg | ORAL_TABLET | Freq: Every day | ORAL | 0 refills | Status: DC

## 2021-05-31 NOTE — Progress Notes (Signed)
?  ? ?I,Emmary Culbreath Robinson,acting as a Education administrator for [employer], PA-C.,have documented all relevant documentation on the behalf of [name], PA-C,as directed by  [employer], PA-C while in the presence of [employer], PA-C. ? ? ?Established patient visit ? ? ?Patient: Brian Bishop   DOB: [birth year]   71 y.o. Male  MRN: [id number] ?Visit Date: 05/31/2021 ? ?Today's healthcare provider: [name], PA-C  ? ?Chief Complaint  ?Patient presents with  ? Hypertension  ? ?Subjective  ?  ?HPI  ?Hypertension, follow-up ? ?BP Readings from Last 3 Encounters:  ?05/31/21 (!) 146/68  ?05/01/21 133/60  ?04/02/19 (!) 148/72  ? Wt Readings from Last 3 Encounters:  ?05/31/21 215 lb 14.4 oz (97.9 kg)  ?05/01/21 218 lb (98.9 kg)  ?04/02/19 220 lb (99.8 kg)  ?  ? ?He was last seen for hypertension 1 months ago.  ?BP at that visit was 133 /60. Management since that visit includes amlodipine 10 mg daily tab/cut in half, lisinopril 5 mg daily. Patient requesting 5 mg tablet of amlodipine/hard to cut. Reports ankle swelling is improved.  ? ?He reports excellent compliance with treatment. ?He is not having side effects.  ?He is following a Regular diet. ?He is not exercising. ?He does not smoke. ? ?Use of agents associated with hypertension: none.  ? ?Outside blood pressures are. Does not do readings.  ?Symptoms: ?No chest pain No chest pressure  ?No palpitations No syncope  ?No dyspnea No orthopnea  ?No paroxysmal nocturnal dyspnea No lower extremity edema  ? ?Pertinent labs ?Lab Results  ?Component Value Date  ? CHOL 216 (H) 05/01/2021  ? HDL 49 05/01/2021  ? LDLCALC 140 (H) 05/01/2021  ? TRIG 152 (H) 05/01/2021  ? CHOLHDL 4.4 05/01/2021  ? Lab Results  ?Component Value Date  ? NA 140 05/01/2021  ? K 4.4 05/01/2021  ? CREATININE 0.99 05/01/2021  ? EGFR 83 05/01/2021  ? GLUCOSE 84 05/01/2021  ? TSH 2.070 05/01/2021  ?  ? ?The 10-year ASCVD risk score (Arnett DK, et al., 2019) is:  23.7% ? ?--------------------------------------------------------------------------------------------------- ? ? ?Medications: ?Outpatient Medications Prior to Visit  ?Medication Sig  ? amLODipine (NORVASC) 10 MG tablet TAKE 1 TABLET BY MOUTH EVERY DAY  ? lisinopril (ZESTRIL) 5 MG tablet Take 1 tablet (5 mg total) by mouth daily.  ? ?No facility-administered medications prior to visit.  ? ? ?Review of Systems  ?Constitutional: Negative.   ?HENT: Negative.    ?Respiratory: Negative.    ?Cardiovascular: Negative.   ?Gastrointestinal: Negative.   ? ? ?  Objective  ?  ?BP (!) 146/68 (BP Location: Right Arm, Patient Position: Sitting, Cuff Size: Normal)   Pulse 64   Temp (!) 97.5 ?F (36.4 ?C) (Oral)   Resp 16   Wt 215 lb 14.4 oz (97.9 kg)   SpO2 99%   BMI 29.28 kg/m?  ? ? ?Physical Exam ?Vitals reviewed.  ?Constitutional:   ?   Appearance: Normal appearance.  ?HENT:  ?   Head: Normocephalic and atraumatic.  ?Cardiovascular:  ?   Rate and Rhythm: Normal rate and regular rhythm.  ?   Pulses: Normal pulses.  ?Pulmonary:  ?   Effort: Pulmonary effort is normal.  ?   Breath sounds: Normal breath sounds.  ?Neurological:  ?   Mental Status: He is alert.  ?Psychiatric:     ?   Behavior: Behavior normal.     ?   Thought Content: Thought content normal.     ?  Judgment: Judgment normal.  ?  ? ?No results found for any visits on 05/31/21. ? Assessment & Plan  ?  ? ?1. Mixed hyperlipidemia ?Ascvd was 20.4% , recommended statin. Patient was hesitant. Advised to start from a small dose.  ?- atorvastatin (LIPITOR) 10 MG tablet; Take 1 tablet (10 mg total) by mouth daily.  Dispense: 90 tablet; Refill: 3 ? ?2. Essential (primary) hypertension ?Recommended to increased dose of amlodipine to 74m. ?Advised on measuring his BP at home - ambulatory BP measures for a mo ?- amLODipine (NORVASC) 10 MG tablet; Take 1 tablet (10 mg total) by mouth daily.  Dispense: 30 tablet; Refill: 0 ?- lisinopril (ZESTRIL) 5 MG tablet; Take 1 tablet (5  mg total) by mouth daily.  Dispense: 30 tablet; Refill: 0 ? ?Follow-up in 1 month for hyperlipidemia, hypertension/physical? ? ?   ?The patient was advised to call back or seek an in-person evaluation if the symptoms worsen or if the condition fails to improve as anticipated. ? ?I discussed the assessment and treatment plan with the patient. The patient was provided an opportunity to ask questions and all were answered. The patient agreed with the plan and demonstrated an understanding of the instructions. ? ?The entirety of the information documented in the History of Present Illness, Review of Systems and Physical Exam were personally obtained by me. Portions of this information were initially documented by the CMA and reviewed by me for thoroughness and accuracy.  ?Portions of this note were created using dictation software and may contain typographical errors.  ? ? ?JMardene Speak PA-C  ?[place]?[phone](phone) ?[phone](fax) ? ?[facility] ?

## 2021-05-31 NOTE — Patient Instructions (Signed)
Mr. Yohn , ?Thank you for taking time to come for your Medicare Wellness Visit. I appreciate your ongoing commitment to your health goals. Please review the following plan we discussed and let me know if I can assist you in the future.  ? ?Screening recommendations/referrals: ?Colonoscopy: 12/12/11 ?Recommended yearly ophthalmology/optometry visit for glaucoma screening and checkup ?Recommended yearly dental visit for hygiene and checkup ? ?Vaccinations: ?Influenza vaccine: n/d ?Pneumococcal vaccine: n/d ?Tdap vaccine: n/d ?Shingles vaccine: Zostavax 12/30/13   ?Covid-19: 02/07/19, 02/28/19, 10/20/19, 05/08/20 ? ?Advanced directives: yes, requested copy ? ?Conditions/risks identified: no ? ?Next appointment: Follow up in one year for your annual wellness visit. 06/04/22 @ 9:15am in person ? ?Preventive Care 69 Years and Older, Male ?Preventive care refers to lifestyle choices and visits with your health care provider that can promote health and wellness. ?What does preventive care include? ?A yearly physical exam. This is also called an annual well check. ?Dental exams once or twice a year. ?Routine eye exams. Ask your health care provider how often you should have your eyes checked. ?Personal lifestyle choices, including: ?Daily care of your teeth and gums. ?Regular physical activity. ?Eating a healthy diet. ?Avoiding tobacco and drug use. ?Limiting alcohol use. ?Practicing safe sex. ?Taking low doses of aspirin every day. ?Taking vitamin and mineral supplements as recommended by your health care provider. ?What happens during an annual well check? ?The services and screenings done by your health care provider during your annual well check will depend on your age, overall health, lifestyle risk factors, and family history of disease. ?Counseling  ?Your health care provider may ask you questions about your: ?Alcohol use. ?Tobacco use. ?Drug use. ?Emotional well-being. ?Home and relationship well-being. ?Sexual  activity. ?Eating habits. ?History of falls. ?Memory and ability to understand (cognition). ?Work and work Statistician. ?Screening  ?You may have the following tests or measurements: ?Height, weight, and BMI. ?Blood pressure. ?Lipid and cholesterol levels. These may be checked every 5 years, or more frequently if you are over 69 years old. ?Skin check. ?Lung cancer screening. You may have this screening every year starting at age 69 if you have a 30-pack-year history of smoking and currently smoke or have quit within the past 15 years. ?Fecal occult blood test (FOBT) of the stool. You may have this test every year starting at age 69. ?Flexible sigmoidoscopy or colonoscopy. You may have a sigmoidoscopy every 5 years or a colonoscopy every 10 years starting at age 69. ?Prostate cancer screening. Recommendations will vary depending on your family history and other risks. ?Hepatitis C blood test. ?Hepatitis B blood test. ?Sexually transmitted disease (STD) testing. ?Diabetes screening. This is done by checking your blood sugar (glucose) after you have not eaten for a while (fasting). You may have this done every 1-3 years. ?Abdominal aortic aneurysm (AAA) screening. You may need this if you are a current or former smoker. ?Osteoporosis. You may be screened starting at age 69 if you are at high risk. ?Talk with your health care provider about your test results, treatment options, and if necessary, the need for more tests. ?Vaccines  ?Your health care provider may recommend certain vaccines, such as: ?Influenza vaccine. This is recommended every year. ?Tetanus, diphtheria, and acellular pertussis (Tdap, Td) vaccine. You may need a Td booster every 10 years. ?Zoster vaccine. You may need this after age 55. ?Pneumococcal 13-valent conjugate (PCV13) vaccine. One dose is recommended after age 69. ?Pneumococcal polysaccharide (PPSV23) vaccine. One dose is recommended after age 69. ?Talk to  your health care provider about which  screenings and vaccines you need and how often you need them. ?This information is not intended to replace advice given to you by your health care provider. Make sure you discuss any questions you have with your health care provider. ?Document Released: 02/03/2015 Document Revised: 09/27/2015 Document Reviewed: 11/08/2014 ?Elsevier Interactive Patient Education ? 2017 [employer]. ? ?Fall Prevention in the Home ?Falls can cause injuries. They can happen to people of all ages. There are many things you can do to make your home safe and to help prevent falls. ?What can I do on the outside of my home? ?Regularly fix the edges of walkways and driveways and fix any cracks. ?Remove anything that might make you trip as you walk through a door, such as a raised step or threshold. ?Trim any bushes or trees on the path to your home. ?Use bright outdoor lighting. ?Clear any walking paths of anything that might make someone trip, such as rocks or tools. ?Regularly check to see if handrails are loose or broken. Make sure that both sides of any steps have handrails. ?Any raised decks and porches should have guardrails on the edges. ?Have any leaves, snow, or ice cleared regularly. ?Use sand or salt on walking paths during winter. ?Clean up any spills in your garage right away. This includes oil or grease spills. ?What can I do in the bathroom? ?Use night lights. ?Install grab bars by the toilet and in the tub and shower. Do not use towel bars as grab bars. ?Use non-skid mats or decals in the tub or shower. ?If you need to sit down in the shower, use a plastic, non-slip stool. ?Keep the floor dry. Clean up any water that spills on the floor as soon as it happens. ?Remove soap buildup in the tub or shower regularly. ?Attach bath mats securely with double-sided non-slip rug tape. ?Do not have throw rugs and other things on the floor that can make you trip. ?What can I do in the bedroom? ?Use night lights. ?Make sure that you have a  light by your bed that is easy to reach. ?Do not use any sheets or blankets that are too big for your bed. They should not hang down onto the floor. ?Have a firm chair that has side arms. You can use this for support while you get dressed. ?Do not have throw rugs and other things on the floor that can make you trip. ?What can I do in the kitchen? ?Clean up any spills right away. ?Avoid walking on wet floors. ?Keep items that you use a lot in easy-to-reach places. ?If you need to reach something above you, use a strong step stool that has a grab bar. ?Keep electrical cords out of the way. ?Do not use floor polish or wax that makes floors slippery. If you must use wax, use non-skid floor wax. ?Do not have throw rugs and other things on the floor that can make you trip. ?What can I do with my stairs? ?Do not leave any items on the stairs. ?Make sure that there are handrails on both sides of the stairs and use them. Fix handrails that are broken or loose. Make sure that handrails are as long as the stairways. ?Check any carpeting to make sure that it is firmly attached to the stairs. Fix any carpet that is loose or worn. ?Avoid having throw rugs at the top or bottom of the stairs. If you do have throw rugs, attach  them to the floor with carpet tape. ?Make sure that you have a light switch at the top of the stairs and the bottom of the stairs. If you do not have them, ask someone to add them for you. ?What else can I do to help prevent falls? ?Wear shoes that: ?Do not have high heels. ?Have rubber bottoms. ?Are comfortable and fit you well. ?Are closed at the toe. Do not wear sandals. ?If you use a stepladder: ?Make sure that it is fully opened. Do not climb a closed stepladder. ?Make sure that both sides of the stepladder are locked into place. ?Ask someone to hold it for you, if possible. ?Clearly mark and make sure that you can see: ?Any grab bars or handrails. ?First and last steps. ?Where the edge of each step  is. ?Use tools that help you move around (mobility aids) if they are needed. These include: ?Canes. ?Walkers. ?Scooters. ?Crutches. ?Turn on the lights when you go into a dark area. Replace any light bulbs as soon

## 2021-05-31 NOTE — Progress Notes (Signed)
? ?Subjective:  ? Brian Bishop is a 71 y.o. male who presents for an Initial Medicare Annual Wellness Visit. ? ?Review of Systems    ? ?  ? ?   ?Objective:  ?  ?There were no vitals filed for this visit. ?There is no height or weight on file to calculate BMI. ? ? ?  09/05/2016  ?  2:01 PM  ?Advanced Directives  ?Does Patient Have a Medical Advance Directive? Yes  ?Type of Paramedic of [place];Living will  ?Would patient like information on creating a medical advance directive? No - Patient declined  ? ? ?Current Medications (verified) ?Outpatient Encounter Medications as of 05/31/2021  ?Medication Sig  ? [DISCONTINUED] amLODipine (NORVASC) 10 MG tablet Take 1 tablet by mouth daily.  ? amLODipine (NORVASC) 10 MG tablet TAKE 1 TABLET BY MOUTH EVERY DAY  ? lisinopril (ZESTRIL) 5 MG tablet Take 1 tablet (5 mg total) by mouth daily.  ? naproxen sodium (ALEVE) 220 MG tablet Take by mouth. (Patient not taking: Reported on 05/31/2021)  ? ?No facility-administered encounter medications on file as of 05/31/2021.  ? ? ?Allergies (verified) ?Yellow jacket venom and Bee venom  ? ?History: ?Past Medical History:  ?Diagnosis Date  ? Depression   ? History of chickenpox   ? History of measles   ? History of mumps   ? ?Past Surgical History:  ?Procedure Laterality Date  ? KNEE SURGERY Right   ? 2005 and 2010  ? ?Family History  ?Problem Relation Age of Onset  ? Breast cancer Mother   ? ?Social History  ? ?Socioeconomic History  ? Marital status: Married  ?  Spouse name: Not on file  ? Number of children: Not on file  ? Years of education: Not on file  ? Highest education level: Not on file  ?Occupational History  ? Occupation: Retired  ?  Comment: January 2020  ?Tobacco Use  ? Smoking status: Former  ?  Packs/day: 1.00  ?  Years: 20.00  ?  Pack years: 20.00  ?  Types: Cigarettes  ?  Quit date: 2001  ?  Years since quitting: 22.3  ? Smokeless tobacco: Never  ?Substance and Sexual Activity  ? Alcohol use: Yes  ?   Comment: drinks beer daily  ? Drug use: No  ? Sexual activity: Not on file  ?Other Topics Concern  ? Not on file  ?Social History Narrative  ? Not on file  ? ?Social Determinants of Health  ? ?Financial Resource Strain: Not on file  ?Food Insecurity: Not on file  ?Transportation Needs: Not on file  ?Physical Activity: Not on file  ?Stress: Not on file  ?Social Connections: Not on file  ? ? ?Tobacco Counseling ?Counseling given: Not Answered ? ? ?Clinical Intake: ? ?Pre-visit preparation completed: Yes ? ?Pain : No/denies pain ? ?  ? ?Nutritional Risks: None ?Diabetes: No ? ?How often do you need to have someone help you when you read instructions, pamphlets, or other written materials from your doctor or pharmacy?: 1 - Never ? ?Diabetic?no ? ?Interpreter Needed?: No ? ?Information entered by :: Kirke Shaggy, LPN ? ? ?Activities of Daily Living ? ?  05/01/2021  ?  8:08 AM  ?In your present state of health, do you have any difficulty performing the following activities:  ?Hearing? 0  ?Vision? 0  ?Difficulty concentrating or making decisions? 0  ?Walking or climbing stairs? 0  ?Dressing or bathing? 0  ?Doing errands, shopping? 0  ? ? ?  Patient Care Team: ?Birdie Sons, MD as PCP - General (Family Medicine) ? ?Indicate any recent Medical Services you may have received from other than [facility] providers in the past year (date may be approximate). ? ?   ?Assessment:  ? This is a routine wellness examination for [place]. ? ?Hearing/Vision screen ?No results found. ? ?Dietary issues and exercise activities discussed: ?  ? ? Goals Addressed   ?None ?  ? ?Depression Screen ? ?  05/01/2021  ?  8:08 AM 03/04/2018  ?  3:11 PM 01/03/2016  ?  8:50 AM  ?PHQ 2/9 Scores  ?PHQ - 2 Score 0 0 0  ?PHQ- 9 Score  0 1  ?  ?Fall Risk ? ?  05/01/2021  ?  8:08 AM 04/02/2019  ?  8:05 AM 03/04/2018  ?  3:11 PM  ?Fall Risk   ?Falls in the past year? 0 0 0  ?Number falls in past yr: 0 0 0  ?Injury with Fall? 0 0 0  ?Risk for fall due to : No Fall Risks     ?Follow up Falls evaluation completed Falls evaluation completed Falls evaluation completed  ? ? ?FALL RISK PREVENTION PERTAINING TO THE HOME: ? ?Any stairs in or around the home? No  ?If so, are there any without handrails? No  ?Home free of loose throw rugs in walkways, pet beds, electrical cords, etc? Yes  ?Adequate lighting in your home to reduce risk of falls? Yes  ? ?ASSISTIVE DEVICES UTILIZED TO PREVENT FALLS: ? ?Life alert? No  ?Use of a cane, walker or w/c? No  ?Grab bars in the bathroom? Yes  ?Shower chair or bench in shower? No  ?Elevated toilet seat or a handicapped toilet? Yes  ? ?TIMED UP AND GO: ? ?Was the test performed? Yes .  ?Length of time to ambulate 10 feet: 4 sec.  ? ?Gait steady and fast without use of assistive device ? ?Cognitive Function: ?  ?  ?  ? ?Immunizations ?Immunization History  ?Administered Date(s) Administered  ? PFIZER(Purple Top)SARS-COV-2 Vaccination 02/07/2019, 02/27/2019  ? Zoster, Live 12/30/2013  ? ? ?TDAP status: Due, Education has been provided regarding the importance of this vaccine. Advised may receive this vaccine at local pharmacy or Health Dept. Aware to provide a copy of the vaccination record if obtained from local pharmacy or Health Dept. Verbalized acceptance and understanding. ? ?Flu Vaccine status: Declined, Education has been provided regarding the importance of this vaccine but patient still declined. Advised may receive this vaccine at local pharmacy or Health Dept. Aware to provide a copy of the vaccination record if obtained from local pharmacy or Health Dept. Verbalized acceptance and understanding. ? ?Pneumococcal vaccine status: Declined,  Education has been provided regarding the importance of this vaccine but patient still declined. Advised may receive this vaccine at local pharmacy or Health Dept. Aware to provide a copy of the vaccination record if obtained from local pharmacy or Health Dept. Verbalized acceptance and understanding.  ? ?Covid-19  vaccine status: Completed vaccines ? ?Qualifies for Shingles Vaccine? Yes   ?Zostavax completed Yes   ?Shingrix Completed?: No.    Education has been provided regarding the importance of this vaccine. Patient has been advised to call insurance company to determine out of pocket expense if they have not yet received this vaccine. Advised may also receive vaccine at local pharmacy or Health Dept. Verbalized acceptance and understanding. ? ?Screening Tests ?Health Maintenance  ?Topic Date Due  ? TETANUS/TDAP  Never  done  ? Zoster Vaccines- Shingrix (1 of 2) Never done  ? Pneumonia Vaccine 73+ Years old (1 - PCV) Never done  ? COVID-19 Vaccine (3 - Booster for Pfizer series) 04/24/2019  ? INFLUENZA VACCINE  08/21/2021  ? COLONOSCOPY (Pts 45-8yr Insurance coverage will need to be confirmed)  12/11/2021  ? Hepatitis C Screening  Completed  ? HPV VACCINES  Aged Out  ? ? ?Health Maintenance ? ?Health Maintenance Due  ?Topic Date Due  ? TETANUS/TDAP  Never done  ? Zoster Vaccines- Shingrix (1 of 2) Never done  ? Pneumonia Vaccine 69 Years old (1 - PCV) Never done  ? COVID-19 Vaccine (3 - Booster for Pfizer series) 04/24/2019  ? ? ?Colorectal cancer screening: Type of screening: Colonoscopy. Completed 12/12/11. Repeat every 10 years ? ?Lung Cancer Screening: (Low Dose CT Chest recommended if Age 951-80years, 30 pack-year currently smoking OR have quit w/in 15years.) does not qualify.  ? ? ?Additional Screening: ? ?Hepatitis C Screening: does qualify; Completed 03/04/18 ? ?Vision Screening: Recommended annual ophthalmology exams for early detection of glaucoma and other disorders of the eye. ?Is the patient up to date with their annual eye exam?  Yes  ?Who is the provider or what is the name of the office in which the patient attends annual eye exams? [facility]?If pt is not established with a provider, would they like to be referred to a provider to establish care? No .  ? ?Dental Screening: Recommended annual  dental exams for proper oral hygiene ? ?Community Resource Referral / Chronic Care Management: ?CRR required this visit?  No  ? ?CCM required this visit?  No  ? ? ?  ?Plan:  ?  ? ?I have personally review

## 2021-06-19 ENCOUNTER — Other Ambulatory Visit: Payer: Self-pay | Admitting: Family Medicine

## 2021-06-19 DIAGNOSIS — I10 Essential (primary) hypertension: Secondary | ICD-10-CM

## 2021-06-26 NOTE — Progress Notes (Signed)
Established patient visit   Patient: Brian Bishop   DOB: 1950-10-01   71 y.o. Male  MRN: [id number] Visit Date: 06/27/2021  Today's healthcare provider: [name], PA-C   No chief complaint on file. CC: fu for depression, HTN, HLD   Subjective    HPI  Patient is a 71 year old male who presents for follow up of chronic health issues after annual wellness exam with nurse health advisor on 05/31/21. Patient had labwork done in April.  Medications: Outpatient Medications Prior to Visit  Medication Sig   amLODipine (NORVASC) 10 MG tablet TAKE 1 TABLET BY MOUTH EVERY DAY   atorvastatin (LIPITOR) 10 MG tablet Take 1 tablet (10 mg total) by mouth daily.   lisinopril (ZESTRIL) 5 MG tablet Take 1 tablet (5 mg total) by mouth daily.   naproxen sodium (ALEVE) 220 MG tablet Take by mouth. (Patient not taking: Reported on 05/31/2021)   No facility-administered medications prior to visit.    Review of Systems  Constitutional: Negative.   HENT: Negative.    Eyes: Negative.   Respiratory: Negative.    Cardiovascular: Negative.   Gastrointestinal: Negative.   Endocrine: Negative.   Genitourinary: Negative.   Musculoskeletal: Negative.   Skin: Negative.   Allergic/Immunologic: Negative.   Neurological: Negative.   Hematological: Negative.   Psychiatric/Behavioral: Negative.        Objective    There were no vitals taken for this visit.   Physical Exam Constitutional:      Appearance: Normal appearance. He is normal weight.  HENT:     Head: Normocephalic and atraumatic.     Right Ear: Tympanic membrane, ear canal and external ear normal.     Left Ear: Tympanic membrane, ear canal and external ear normal.     Nose: Nose normal.     Mouth/Throat:     Mouth: Mucous membranes are moist.     Pharynx: Oropharynx is clear.  Eyes:     Extraocular Movements: Extraocular movements intact.     Conjunctiva/sclera: Conjunctivae normal.     Pupils: Pupils are equal, round, and  reactive to light.  Cardiovascular:     Rate and Rhythm: Normal rate and regular rhythm.     Pulses: Normal pulses.     Heart sounds: Normal heart sounds.  Pulmonary:     Effort: Pulmonary effort is normal.     Breath sounds: Normal breath sounds.  Abdominal:     General: Abdomen is flat. Bowel sounds are normal.     Palpations: Abdomen is soft.  Musculoskeletal:        General: Normal range of motion.     Cervical back: Normal range of motion and neck supple.  Skin:    General: Skin is warm and dry.  Neurological:     General: No focal deficit present.     Mental Status: He is alert and oriented to person, place, and time. Mental status is at baseline.  Psychiatric:        Mood and Affect: Mood normal.        Behavior: Behavior normal.        Thought Content: Thought content normal.        Judgment: Judgment normal.      No results found for any visits on 06/27/21.  Assessment & Plan        FU in   with Dr. Caryn Section    The patient was advised to call back or seek an in-person  evaluation if the symptoms worsen or if the condition fails to improve as anticipated.  I discussed the assessment and treatment plan with the patient. The patient was provided an opportunity to ask questions and all were answered. The patient agreed with the plan and demonstrated an understanding of the instructions.  The entirety of the information documented in the History of Present Illness, Review of Systems and Physical Exam were personally obtained by me. Portions of this information were initially documented by the CMA and reviewed by me for thoroughness and accuracy.  Portions of this note were created using dictation software and may contain typographical errors.       [name], PA-C  [facility] [phone] (phone) [phone] (fax)  [place]

## 2021-06-27 ENCOUNTER — Ambulatory Visit (INDEPENDENT_AMBULATORY_CARE_PROVIDER_SITE_OTHER): Payer: Medicare Other | Admitting: Physician Assistant

## 2021-06-27 ENCOUNTER — Encounter: Payer: Self-pay | Admitting: Physician Assistant

## 2021-06-27 VITALS — BP 114/63 | HR 62 | Temp 98.3°F | Resp 16 | Wt 215.3 lb

## 2021-06-27 DIAGNOSIS — Z23 Encounter for immunization: Secondary | ICD-10-CM | POA: Diagnosis not present

## 2021-06-27 DIAGNOSIS — E782 Mixed hyperlipidemia: Secondary | ICD-10-CM

## 2021-06-27 DIAGNOSIS — I10 Essential (primary) hypertension: Secondary | ICD-10-CM

## 2021-06-27 DIAGNOSIS — F32A Depression, unspecified: Secondary | ICD-10-CM

## 2021-06-27 MED ORDER — AMLODIPINE BESYLATE 10 MG PO TABS: 10.0000 mg | ORAL_TABLET | Freq: Every day | ORAL | 3 refills | Status: DC

## 2021-06-27 MED ORDER — LISINOPRIL 5 MG PO TABS: 5.0000 mg | ORAL_TABLET | Freq: Every day | ORAL | 3 refills | Status: DC

## 2021-06-27 NOTE — Progress Notes (Signed)
I,Fedra Lanter Robinson,acting as a Education administrator for [employer], PA-C.,have documented all relevant documentation on the behalf of [name], PA-C,as directed by  [employer], PA-C while in the presence of [employer], PA-C.   Established patient visit   Patient: Brian Bishop   DOB: [birth year]   72 y.o. Male  MRN: [id number] Visit Date: 06/27/2021  Today's healthcare provider: [name], PA-C   No chief complaint on file.  Subjective    Patient is a 71 yr old male presenting for follow up hyperlipidemia and hypertension. Patient was seen with [facility] on 05-31-21 for [facility] Annual Wellness.    Hypertension, follow-up  BP Readings from Last 3 Encounters:  06/27/21 114/63  05/31/21 (!) 146/68  05/01/21 133/60   Wt Readings from Last 3 Encounters:  06/27/21 215 lb 4.8 oz (97.7 kg)  05/31/21 215 lb 14.4 oz (97.9 kg)  05/01/21 218 lb (98.9 kg)     He was last seen for hypertension 1 months ago.  BP at that visit was 146/68. Management since that visit includes: Recommended to increased dose of amlodipine to 26m. Advised on measuring his BP at home - ambulatory BP measures for a mo.  - amLODipine (NORVASC) 10 MG tablet; Take 1 tablet (10 mg total) by mouth daily.  - lisinopril (ZESTRIL) 5 MG tablet; He reports excellent compliance with treatment. He is not having side effects.  He is following a Regular diet. He is exercising. He does not smoke.  Use of agents associated with hypertension: none.   Outside blood pressures are: takes sometimes/133/66 lowest / highest 155/70 Symptoms: No chest pain No chest pressure  No palpitations No syncope  No dyspnea No orthopnea  No paroxysmal nocturnal dyspnea No lower extremity edema   Pertinent labs Lab Results  Component Value Date   CHOL 216 (H) 05/01/2021   HDL 49 05/01/2021   LDLCALC 140 (H) 05/01/2021   TRIG 152 (H) 05/01/2021   CHOLHDL 4.4 05/01/2021   Lab Results  Component Value Date   NA 140 05/01/2021   K  4.4 05/01/2021   CREATININE 0.99 05/01/2021   EGFR 83 05/01/2021   GLUCOSE 84 05/01/2021   TSH 2.070 05/01/2021     The 10-year ASCVD risk score (Arnett DK, et al., 2019) is: 15.9%  ---------------------------------------------------------------------------------------------------  Lipid/Cholesterol, Follow-up  Last lipid panel Other pertinent labs  Lab Results  Component Value Date   CHOL 216 (H) 05/01/2021   HDL 49 05/01/2021   LDLCALC 140 (H) 05/01/2021   TRIG 152 (H) 05/01/2021   CHOLHDL 4.4 05/01/2021   Lab Results  Component Value Date   ALT 32 05/01/2021   AST 28 05/01/2021   PLT 228 05/01/2021   TSH 2.070 05/01/2021     He was last seen for this 1 months ago.  Management since that visit includes:Patient was hesitant. Advised to start from a small dose.  - atorvastatin (LIPITOR) 10 MG tablet; Take 1 tablet (10 mg total) by mouth daily.   He reports excellent compliance with treatment. He is not having side effects.  Symptoms: No chest pain No chest pressure/discomfort  No dyspnea No lower extremity edema  No numbness or tingling of extremity No orthopnea  No palpitations No paroxysmal nocturnal dyspnea  No speech difficulty No syncope    The 10-year ASCVD risk score (Arnett DK, et al., 2019) is: 15.9%  ---------------------------------------------------------------------------------------------------  Medications: Outpatient Medications Prior to Visit  Medication Sig   amLODipine (NORVASC) 10 MG tablet TAKE 1  TABLET BY MOUTH EVERY DAY   atorvastatin (LIPITOR) 10 MG tablet Take 1 tablet (10 mg total) by mouth daily.   lisinopril (ZESTRIL) 5 MG tablet Take 1 tablet (5 mg total) by mouth daily.   naproxen sodium (ALEVE) 220 MG tablet Take by mouth.   No facility-administered medications prior to visit.    Review of Systems  Respiratory: Negative.    Cardiovascular: Negative.   Gastrointestinal: Negative.   See HPI    Objective    BP 114/63 (BP  Location: Right Arm, Patient Position: Sitting, Cuff Size: Normal)   Pulse 62   Temp 98.3 F (36.8 C) (Oral)   Resp 16   Wt 215 lb 4.8 oz (97.7 kg)   SpO2 98%   BMI 29.20 kg/m    Physical Exam Vitals reviewed.  Constitutional:      General: He is not in acute distress.    Appearance: Normal appearance. He is well-developed. He is not diaphoretic.  HENT:     Head: Normocephalic and atraumatic.     Right Ear: Tympanic membrane, ear canal and external ear normal.     Left Ear: Tympanic membrane, ear canal and external ear normal.     Nose: Nose normal.     Mouth/Throat:     Mouth: Mucous membranes are moist.     Pharynx: Oropharynx is clear. No oropharyngeal exudate.  Eyes:     General: No scleral icterus.    Conjunctiva/sclera: Conjunctivae normal.     Pupils: Pupils are equal, round, and reactive to light.  Neck:     Thyroid: No thyromegaly.  Cardiovascular:     Rate and Rhythm: Normal rate and regular rhythm.     Pulses: Normal pulses.     Heart sounds: Normal heart sounds. No murmur heard. Pulmonary:     Effort: Pulmonary effort is normal. No respiratory distress.     Breath sounds: Normal breath sounds. No wheezing or rales.  Abdominal:     General: There is no distension.     Palpations: Abdomen is soft.     Tenderness: There is no abdominal tenderness.  Musculoskeletal:        General: No deformity.     Cervical back: Neck supple.     Right lower leg: No edema.     Left lower leg: No edema.  Lymphadenopathy:     Cervical: No cervical adenopathy.  Skin:    General: Skin is warm and dry.     Findings: No rash.  Neurological:     Mental Status: He is alert and oriented to person, place, and time. Mental status is at baseline.     Sensory: No sensory deficit.     Motor: No weakness.     Gait: Gait normal.  Psychiatric:        Mood and Affect: Mood normal.        Behavior: Behavior normal.        Thought Content: Thought content normal.     No results  found for any visits on 06/27/21.  Assessment & Plan     1. Essential (primary) hypertension Chronic. Stable - amLODipine (NORVASC) 10 MG tablet; Take 1 tablet (10 mg total) by mouth daily.  Dispense: 90 tablet; Refill: 3 - lisinopril (ZESTRIL) 5 MG tablet; Take 1 tablet (5 mg total) by mouth daily.  Dispense: 90 tablet; Refill: 3 Strongly encouraged to measure his BP at home and keep it below 140/90 Contact us if his BP will be higher than  target BP Explained the risks of having uncontrolled high BP    2. Mixed hyperlipidemia Chronic. Stable. Continue statin.  Benefits and risks were discussed. Patient agreed and expressed his understanding.   3. Depression, unspecified depression type  Stable.     05/31/2021    8:57 AM 05/01/2021    8:08 AM 03/04/2018    3:11 PM  Depression screen PHQ 2/9  Decreased Interest 0 0 0  Down, Depressed, Hopeless 0 0 0  PHQ - 2 Score 0 0 0  Altered sleeping   0  Tired, decreased energy   0  Change in appetite   0  Feeling bad or failure about yourself    0  Trouble concentrating   0  Moving slowly or fidgety/restless   0  Suicidal thoughts   0  PHQ-9 Score   0  Difficult doing work/chores   Not difficult at all     4. Tdap administration Was administered at [facility] in [place]/in April of 2023 But it was not registered in [place]  4. Need for shingles vaccine Declined at this visit  5. Need for pneumococcal vaccination Declined at this visit  Pt is scheduled for an annual visit with Dr. Caryn Section in a year     The patient was advised to call back or seek an in-person evaluation if the symptoms worsen or if the condition fails to improve as anticipated.  I discussed the assessment and treatment plan with the patient. The patient was provided an opportunity to ask questions and all were answered. The patient agreed with the plan and demonstrated an understanding of the instructions.  The entirety of the information documented in the History of  Present Illness, Review of Systems and Physical Exam were personally obtained by me. Portions of this information were initially documented by the CMA and reviewed by me for thoroughness and accuracy.  Portions of this note were created using dictation software and may contain typographical errors.        Total encounter time more than 30 minutes  Greater than 50% was spent in counseling and coordination of care with the patient   Elberta Leatherwood  [facility] [phone] (phone) [phone] (fax)  [place]

## 2022-04-29 DIAGNOSIS — M25561 Pain in right knee: Secondary | ICD-10-CM | POA: Diagnosis not present

## 2022-04-29 DIAGNOSIS — M1711 Unilateral primary osteoarthritis, right knee: Secondary | ICD-10-CM | POA: Diagnosis not present

## 2022-04-29 DIAGNOSIS — G8929 Other chronic pain: Secondary | ICD-10-CM | POA: Diagnosis not present

## 2022-05-24 ENCOUNTER — Other Ambulatory Visit: Payer: Self-pay | Admitting: Physician Assistant

## 2022-05-24 DIAGNOSIS — E782 Mixed hyperlipidemia: Secondary | ICD-10-CM

## 2022-05-24 NOTE — Telephone Encounter (Signed)
Requested medication (s) are due for refill today: yes  Requested medication (s) are on the active medication list: yes  Last refill:  05/31/21 #90/3  Future visit scheduled: AWV appt 06/04/22  Notes to clinic:  Unable to refill per protocol due to failed labs, no updated results. Has upcoming AWV appt, didn't know if needed additional OV.    Requested Prescriptions  Pending Prescriptions Disp Refills   atorvastatin (LIPITOR) 10 MG tablet [Pharmacy Med Name: ATORVASTATIN 10 MG TABLET] 90 tablet 3    Sig: TAKE 1 TABLET BY MOUTH EVERY DAY     Cardiovascular:  Antilipid - Statins Failed - 05/24/2022  2:16 AM      Failed - Lipid Panel in normal range within the last 12 months    Cholesterol, Total  Date Value Ref Range Status  05/01/2021 216 (H) 100 - 199 mg/dL Final   LDL Chol Calc (NIH)  Date Value Ref Range Status  05/01/2021 140 (H) 0 - 99 mg/dL Final   HDL  Date Value Ref Range Status  05/01/2021 49 >39 mg/dL Final   Triglycerides  Date Value Ref Range Status  05/01/2021 152 (H) 0 - 149 mg/dL Final         Passed - Patient is not pregnant      Passed - Valid encounter within last 12 months    Recent Outpatient Visits           11 months ago Mixed hyperlipidemia   [place] [facility] [place], [place], PA-C   1 year ago Prescription for Tdap. Vaccine not administered in office.    [facility] [place], [place], [place]   2 years ago Seasonal allergic rhinitis due to pollen   [facility] [place], Jodell Cipro, PA-C   3 years ago Prostate cancer screening   [facility] Malva Limes, MD   3 years ago Essential (primary) hypertension   [place] [facility] Malva Limes, MD

## 2022-06-04 ENCOUNTER — Ambulatory Visit: Payer: Medicare HMO

## 2022-06-04 VITALS — BP 122/74 | Ht 71.5 in | Wt 220.0 lb

## 2022-06-04 DIAGNOSIS — Z0000 Encounter for general adult medical examination without abnormal findings: Secondary | ICD-10-CM

## 2022-06-04 DIAGNOSIS — Z1211 Encounter for screening for malignant neoplasm of colon: Secondary | ICD-10-CM

## 2022-06-04 DIAGNOSIS — Z0100 Encounter for examination of eyes and vision without abnormal findings: Secondary | ICD-10-CM

## 2022-06-04 NOTE — Progress Notes (Signed)
Subjective:   Brian Bishop is a 72 y.o. male who presents for Medicare Annual/Subsequent preventive examination.  Review of Systems         Objective:    Today's Vitals   06/04/22 0913  BP: 122/74  Weight: 220 lb (99.8 kg)  Height: 5' 11.5" (1.816 m)   Body mass index is 30.26 kg/m.     06/04/2022    9:21 AM 05/31/2021    8:59 AM 09/05/2016    2:01 PM  Advanced Directives  Does Patient Have a Medical Advance Directive? Yes Yes Yes  Type of Special educational needs teacher of [employer];Living will Healthcare Power of [place];Living will  Does patient want to make changes to medical advance directive?  Yes (Inpatient - patient defers changing a medical advance directive and declines information at this time)   Copy of Healthcare Power of Attorney in Chart?  No - copy requested   Would patient like information on creating a medical advance directive?   No - Patient declined    Current Medications (verified) Outpatient Encounter Medications as of 06/04/2022  Medication Sig   amLODipine (NORVASC) 10 MG tablet Take 1 tablet (10 mg total) by mouth daily.   atorvastatin (LIPITOR) 10 MG tablet TAKE 1 TABLET BY MOUTH EVERY DAY   lisinopril (ZESTRIL) 5 MG tablet Take 1 tablet (5 mg total) by mouth daily.   naproxen sodium (ALEVE) 220 MG tablet Take by mouth.   No facility-administered encounter medications on file as of 06/04/2022.    Allergies (verified) Yellow jacket venom and Bee venom   History: Past Medical History:  Diagnosis Date   Depression    History of chickenpox    History of measles    History of mumps    Hypertension    Past Surgical History:  Procedure Laterality Date   KNEE SURGERY Right    2005 and 2010   Family History  Problem Relation Age of Onset   Breast cancer Mother    Cancer Mother    Social History   Socioeconomic History   Marital status: Married    Spouse name: Not on file   Number of children: Not on file   Years of education:  Not on file   Highest education level: Not on file  Occupational History   Occupation: Retired    Comment: January 2020  Tobacco Use   Smoking status: Former    Packs/day: 1.00    Years: 20.00    Additional pack years: 0.00    Total pack years: 20.00    Types: Cigarettes    Quit date: 01/22/1999    Years since quitting: 23.3   Smokeless tobacco: Never  Substance and Sexual Activity   Alcohol use: Yes    Alcohol/week: 12.0 standard drinks of alcohol    Types: 12 Cans of beer per week    Comment: drinks beer daily   Drug use: No   Sexual activity: Not Currently  Other Topics Concern   Not on file  Social History Narrative   Not on file   Social Determinants of Health   Financial Resource Strain: Low Risk  (05/31/2022)   Overall Financial Resource Strain (CARDIA)    Difficulty of Paying Living Expenses: Not hard at all  Food Insecurity: No Food Insecurity (05/31/2022)   Hunger Vital Sign    Worried About Running Out of Food in the Last Year: Never true    Ran Out of Food in the Last Year: Never true  Transportation Needs: No Transportation Needs (05/31/2022)   PRAPARE - Administrator, Civil Service (Medical): No    Lack of Transportation (Non-Medical): No  Physical Activity: Insufficiently Active (05/31/2021)   Exercise Vital Sign    Days of Exercise per Week: 3 days    Minutes of Exercise per Session: 30 min  Stress: No Stress Concern Present (05/31/2022)   [employer] of Occupational Health - Occupational Stress Questionnaire    Feeling of Stress : Not at all  Social Connections: Unknown (05/31/2022)   Social Connection and Isolation Panel [NHANES]    Frequency of Communication with Friends and Family: Once a week    Frequency of Social Gatherings with Friends and Family: Once a week    Attends Religious Services: Not on Marketing executive or Organizations: Yes    Attends Engineer, structural: More than 4 times per year    Marital  Status: Married    Tobacco Counseling Counseling given: Not Answered  Clinical Intake:  Pre-visit preparation completed: Yes  Pain : No/denies pain   BMI - recorded: 30.26 Nutritional Status: BMI > 30  Obese Nutritional Risks: None Diabetes: No  How often do you need to have someone help you when you read instructions, pamphlets, or other written materials from your doctor or pharmacy?: 1 - Never  Diabetic?no    Comments: lives with wife Information entered by :: B.Takashi Korol,LPN   Activities of Daily Living    05/31/2022    8:45 AM 06/27/2021   10:48 AM  In your present state of health, do you have any difficulty performing the following activities:  Hearing? 0 0  Vision? 0 0  Difficulty concentrating or making decisions? 0 0  Walking or climbing stairs? 0 0  Dressing or bathing? 0 0  Doing errands, shopping? 0 0  Preparing Food and eating ? N   Using the Toilet? N   In the past six months, have you accidently leaked urine? N   Do you have problems with loss of bowel control? N   Managing your Medications? N   Managing your Finances? N   Housekeeping or managing your Housekeeping? N     Patient Care Team: Malva Limes, MD as PCP - General (Family Medicine)  Indicate any recent Medical Services you may have received from other than [facility] providers in the past year (date may be approximate).     Assessment:   This is a routine wellness examination for [name].  Hearing/Vision screen Hearing Screening - Comments:: Adequate hearing Vision Screening - Comments:: Adequate vision with glasses Not seen in 2 years  Dietary issues and exercise activities discussed:     Goals Addressed             This Visit's Progress    DIET - EAT MORE FRUITS AND VEGETABLES   On track      Depression Screen    06/04/2022    9:19 AM 06/27/2021   10:48 AM 05/31/2021    8:57 AM 05/01/2021    8:08 AM 03/04/2018    3:11 PM 01/03/2016    8:50 AM  PHQ 2/9 Scores  PHQ - 2  Score 0 0 0 0 0 0  PHQ- 9 Score  0   0 1    Fall Risk    05/31/2022    8:45 AM 06/27/2021   10:48 AM 06/23/2021    8:59 AM 05/31/2021    9:00 AM 05/01/2021  8:08 AM  Fall Risk   Falls in the past year? 0 0 0 0 0  Number falls in past yr:  0  0 0  Injury with Fall?  0  0 0  Risk for fall due to :    No Fall Risks No Fall Risks  Follow up  Falls evaluation completed  Falls evaluation completed Falls evaluation completed    FALL RISK PREVENTION PERTAINING TO THE HOME:  Any stairs in or around the home? Yes  If so, are there any without handrails? Yes  Home free of loose throw rugs in walkways, pet beds, electrical cords, etc? Yes  Adequate lighting in your home to reduce risk of falls? Yes   ASSISTIVE DEVICES UTILIZED TO PREVENT FALLS:  Life alert? No  Use of a cane, walker or w/c? No  Grab bars in the bathroom? Yes  Shower chair or bench in shower? No  Elevated toilet seat or a handicapped toilet? Yes   TIMED UP AND GO:  Was the test performed? Yes .  Length of time to ambulate 10 feet: 8 sec.   Gait steady and fast without use of assistive device  Cognitive Function:        06/04/2022    9:22 AM  6CIT Screen  What Year? 0 points  What month? 0 points  What time? 0 points  Count back from 20 0 points  Months in reverse 0 points  Repeat phrase 0 points  Total Score 0 points    Immunizations Immunization History  Administered Date(s) Administered   PFIZER(Purple Top)SARS-COV-2 Vaccination 02/07/2019, 02/28/2019, 10/20/2019, 05/08/2020   Zoster, Live 12/30/2013    TDAP status: Up to date  Flu Vaccine status: Declined, Education has been provided regarding the importance of this vaccine but patient still declined. Advised may receive this vaccine at local pharmacy or Health Dept. Aware to provide a copy of the vaccination record if obtained from local pharmacy or Health Dept. Verbalized acceptance and understanding.  Pneumococcal vaccine status: Declined,   Education has been provided regarding the importance of this vaccine but patient still declined. Advised may receive this vaccine at local pharmacy or Health Dept. Aware to provide a copy of the vaccination record if obtained from local pharmacy or Health Dept. Verbalized acceptance and understanding.   Covid-19 vaccine status: Completed vaccines  Qualifies for Shingles Vaccine? Yes   Zostavax completed No   Shingrix Completed?: No.    Education has been provided regarding the importance of this vaccine. Patient has been advised to call insurance company to determine out of pocket expense if they have not yet received this vaccine. Advised may also receive vaccine at local pharmacy or Health Dept. Verbalized acceptance and understanding.  Screening Tests Health Maintenance  Topic Date Due   DTaP/Tdap/Td (1 - Tdap) Never done   Zoster Vaccines- Shingrix (1 of 2) Never done   COVID-19 Vaccine (5 - 2023-24 season) 09/21/2021   COLONOSCOPY (Pts 45-65yrs Insurance coverage will need to be confirmed)  12/11/2021   Pneumonia Vaccine 23+ Years old (1 of 1 - PCV) 06/30/2022 (Originally 11/10/2017)   INFLUENZA VACCINE  08/22/2022   Medicare Annual Wellness (AWV)  06/04/2023   Hepatitis C Screening  Completed   HPV VACCINES  Aged Out    Health Maintenance  Health Maintenance Due  Topic Date Due   DTaP/Tdap/Td (1 - Tdap) Never done   Zoster Vaccines- Shingrix (1 of 2) Never done   COVID-19 Vaccine (5 - 2023-24 season) 09/21/2021  COLONOSCOPY (Pts 45-51yrs Insurance coverage will need to be confirmed)  12/11/2021    Colorectal cancer screening: Type of screening: Colonoscopy. Completed no. Repeat every 10 years Pt says will do cologard  Lung Cancer Screening: (Low Dose CT Chest recommended if Age 26-80 years, 30 pack-year currently smoking OR have quit w/in 15years.) does not qualify.   Lung Cancer Screening Referral: no  Additional Screening:  Hepatitis C Screening: does not qualify;  Completed yes  Vision Screening: Recommended annual ophthalmology exams for early detection of glaucoma and other disorders of the eye. Is the patient up to date with their annual eye exam?  No  Who is the provider or what is the name of the office in which the patient attends annual eye exams? none If pt is not established with a provider, would they like to be referred to a provider to establish care? Yes .   Dental Screening: Recommended annual dental exams for proper oral hygiene  Community Resource Referral / Chronic Care Management: CRR required this visit?  No   CCM required this visit?  No     Plan:     I have personally reviewed and noted the following in the patient's chart:   Medical and social history Use of alcohol, tobacco or illicit drugs  Current medications and supplements including opioid prescriptions. Patient is not currently taking opioid prescriptions. Functional ability and status Nutritional status Physical activity Advanced directives List of other physicians Hospitalizations, surgeries, and ER visits in previous 12 months Vitals Screenings to include cognitive, depression, and falls Referrals and appointments  In addition, I have reviewed and discussed with patient certain preventive protocols, quality metrics, and best practice recommendations. A written personalized care plan for preventive services as well as general preventive health recommendations were provided to patient.     Sue Lush, LPN   1/61/0960   Nurse Notes: The patient states he is doing well and has no concerns or questions at this time. Pt is seeking eye doctor in [place] (recently moved).  *Referral for Ophthalmology made/Dr Maylon Peppers location *Order for Cologard plced (requested by pt)

## 2022-06-04 NOTE — Patient Instructions (Addendum)
Brian Bishop , Thank you for taking time to come for your Medicare Wellness Visit. I appreciate your ongoing commitment to your health goals. Please review the following plan we discussed and let me know if I can assist you in the future.   These are the goals we discussed:  Goals      DIET - EAT MORE FRUITS AND VEGETABLES        This is a list of the screening recommended for you and due dates:  Health Maintenance  Topic Date Due   DTaP/Tdap/Td vaccine (1 - Tdap) Never done   Zoster (Shingles) Vaccine (1 of 2) Never done   COVID-19 Vaccine (5 - 2023-24 season) 09/21/2021   Colon Cancer Screening  12/11/2021   Pneumonia Vaccine (1 of 1 - PCV) 06/30/2022*   Flu Shot  08/22/2022   Medicare Annual Wellness Visit  06/04/2023   Hepatitis C Screening: USPSTF Recommendation to screen - Ages 18-79 yo.  Completed   HPV Vaccine  Aged Out  *Topic was postponed. The date shown is not the original due date.    Advanced directives: yes  Conditions/risks identified: none  Next appointment: Follow up in one year for your annual wellness visit. 06/09/2023 @ 8:45am in person  Preventive Care 65 Years and Older, Male  Preventive care refers to lifestyle choices and visits with your health care provider that can promote health and wellness. What does preventive care include? A yearly physical exam. This is also called an annual well check. Dental exams once or twice a year. Routine eye exams. Ask your health care provider how often you should have your eyes checked. Personal lifestyle choices, including: Daily care of your teeth and gums. Regular physical activity. Eating a healthy diet. Avoiding tobacco and drug use. Limiting alcohol use. Practicing safe sex. Taking low doses of aspirin every day. Taking vitamin and mineral supplements as recommended by your health care provider. What happens during an annual well check? The services and screenings done by your health care provider during  your annual well check will depend on your age, overall health, lifestyle risk factors, and family history of disease. Counseling  Your health care provider may ask you questions about your: Alcohol use. Tobacco use. Drug use. Emotional well-being. Home and relationship well-being. Sexual activity. Eating habits. History of falls. Memory and ability to understand (cognition). Work and work Astronomer. Screening  You may have the following tests or measurements: Height, weight, and BMI. Blood pressure. Lipid and cholesterol levels. These may be checked every 5 years, or more frequently if you are over 75 years old. Skin check. Lung cancer screening. You may have this screening every year starting at age 56 if you have a 30-pack-year history of smoking and currently smoke or have quit within the past 15 years. Fecal occult blood test (FOBT) of the stool. You may have this test every year starting at age 37. Flexible sigmoidoscopy or colonoscopy. You may have a sigmoidoscopy every 5 years or a colonoscopy every 10 years starting at age 83. Prostate cancer screening. Recommendations will vary depending on your family history and other risks. Hepatitis C blood test. Hepatitis B blood test. Sexually transmitted disease (STD) testing. Diabetes screening. This is done by checking your blood sugar (glucose) after you have not eaten for a while (fasting). You may have this done every 1-3 years. Abdominal aortic aneurysm (AAA) screening. You may need this if you are a current or former smoker. Osteoporosis. You may be screened starting  at age 15 if you are at high risk. Talk with your health care provider about your test results, treatment options, and if necessary, the need for more tests. Vaccines  Your health care provider may recommend certain vaccines, such as: Influenza vaccine. This is recommended every year. Tetanus, diphtheria, and acellular pertussis (Tdap, Td) vaccine. You may need  a Td booster every 10 years. Zoster vaccine. You may need this after age 56. Pneumococcal 13-valent conjugate (PCV13) vaccine. One dose is recommended after age 9. Pneumococcal polysaccharide (PPSV23) vaccine. One dose is recommended after age 3. Talk to your health care provider about which screenings and vaccines you need and how often you need them. This information is not intended to replace advice given to you by your health care provider. Make sure you discuss any questions you have with your health care provider. Document Released: 02/03/2015 Document Revised: 09/27/2015 Document Reviewed: 11/08/2014 Elsevier Interactive Patient Education  2017 [place] Prevention in the Home Falls can cause injuries. They can happen to people of all ages. There are many things you can do to make your home safe and to help prevent falls. What can I do on the outside of my home? Regularly fix the edges of walkways and driveways and fix any cracks. Remove anything that might make you trip as you walk through a door, such as a raised step or threshold. Trim any bushes or trees on the path to your home. Use bright outdoor lighting. Clear any walking paths of anything that might make someone trip, such as rocks or tools. Regularly check to see if handrails are loose or broken. Make sure that both sides of any steps have handrails. Any raised decks and porches should have guardrails on the edges. Have any leaves, snow, or ice cleared regularly. Use sand or salt on walking paths during winter. Clean up any spills in your garage right away. This includes oil or grease spills. What can I do in the bathroom? Use night lights. Install grab bars by the toilet and in the tub and shower. Do not use towel bars as grab bars. Use non-skid mats or decals in the tub or shower. If you need to sit down in the shower, use a plastic, non-slip stool. Keep the floor dry. Clean up any water that spills on the  floor as soon as it happens. Remove soap buildup in the tub or shower regularly. Attach bath mats securely with double-sided non-slip rug tape. Do not have throw rugs and other things on the floor that can make you trip. What can I do in the bedroom? Use night lights. Make sure that you have a light by your bed that is easy to reach. Do not use any sheets or blankets that are too big for your bed. They should not hang down onto the floor. Have a firm chair that has side arms. You can use this for support while you get dressed. Do not have throw rugs and other things on the floor that can make you trip. What can I do in the kitchen? Clean up any spills right away. Avoid walking on wet floors. Keep items that you use a lot in easy-to-reach places. If you need to reach something above you, use a strong step stool that has a grab bar. Keep electrical cords out of the way. Do not use floor polish or wax that makes floors slippery. If you must use wax, use non-skid floor wax. Do not have throw rugs  and other things on the floor that can make you trip. What can I do with my stairs? Do not leave any items on the stairs. Make sure that there are handrails on both sides of the stairs and use them. Fix handrails that are broken or loose. Make sure that handrails are as long as the stairways. Check any carpeting to make sure that it is firmly attached to the stairs. Fix any carpet that is loose or worn. Avoid having throw rugs at the top or bottom of the stairs. If you do have throw rugs, attach them to the floor with carpet tape. Make sure that you have a light switch at the top of the stairs and the bottom of the stairs. If you do not have them, ask someone to add them for you. What else can I do to help prevent falls? Wear shoes that: Do not have high heels. Have rubber bottoms. Are comfortable and fit you well. Are closed at the toe. Do not wear sandals. If you use a stepladder: Make sure that  it is fully opened. Do not climb a closed stepladder. Make sure that both sides of the stepladder are locked into place. Ask someone to hold it for you, if possible. Clearly mark and make sure that you can see: Any grab bars or handrails. First and last steps. Where the edge of each step is. Use tools that help you move around (mobility aids) if they are needed. These include: Canes. Walkers. Scooters. Crutches. Turn on the lights when you go into a dark area. Replace any light bulbs as soon as they burn out. Set up your furniture so you have a clear path. Avoid moving your furniture around. If any of your floors are uneven, fix them. If there are any pets around you, be aware of where they are. Review your medicines with your doctor. Some medicines can make you feel dizzy. This can increase your chance of falling. Ask your doctor what other things that you can do to help prevent falls. This information is not intended to replace advice given to you by your health care provider. Make sure you discuss any questions you have with your health care provider. Document Released: 11/03/2008 Document Revised: 06/15/2015 Document Reviewed: 02/11/2014 Elsevier Interactive Patient Education  2017 [employer].

## 2022-07-11 ENCOUNTER — Other Ambulatory Visit: Payer: Self-pay | Admitting: Physician Assistant

## 2022-07-11 DIAGNOSIS — I10 Essential (primary) hypertension: Secondary | ICD-10-CM

## 2022-07-11 NOTE — Telephone Encounter (Signed)
Requested Prescriptions  Pending Prescriptions Disp Refills   amLODipine (NORVASC) 10 MG tablet [Pharmacy Med Name: AMLODIPINE BESYLATE 10 MG TAB] 90 tablet 0    Sig: TAKE 1 TABLET BY MOUTH EVERY DAY     Cardiovascular: Calcium Channel Blockers 2 Passed - 07/11/2022  2:32 AM      Passed - Last BP in normal range    BP Readings from Last 1 Encounters:  06/04/22 122/74         Passed - Last Heart Rate in normal range    Pulse Readings from Last 1 Encounters:  06/27/21 62         Passed - Valid encounter within last 6 months    Recent Outpatient Visits           1 year ago Mixed hyperlipidemia   [place] [facility] [place], [place], PA-C   1 year ago Prescription for Tdap. Vaccine not administered in office.    [facility] [place], [place], [place]   2 years ago Seasonal allergic rhinitis due to pollen   [place] [facility] Chrism[place], Jodell Cipro, PA-C   3 years ago Prostate cancer screening   [facility] Malva Limes, MD   3 years ago Essential (primary) hypertension   [facility] Malva Limes, MD

## 2022-07-12 ENCOUNTER — Other Ambulatory Visit: Payer: Self-pay | Admitting: Physician Assistant

## 2022-07-12 DIAGNOSIS — I10 Essential (primary) hypertension: Secondary | ICD-10-CM

## 2022-07-12 NOTE — Telephone Encounter (Signed)
Requested Prescriptions  Pending Prescriptions Disp Refills   lisinopril (ZESTRIL) 5 MG tablet [Pharmacy Med Name: LISINOPRIL 5 MG TABLET] 90 tablet 0    Sig: TAKE 1 TABLET (5 MG TOTAL) BY MOUTH DAILY.     Cardiovascular:  ACE Inhibitors Failed - 07/12/2022  2:33 AM      Failed - Cr in normal range and within 180 days    Creatinine  Date Value Ref Range Status  04/26/2013 0.87 0.60 - 1.30 mg/dL Final   Creatinine, Ser  Date Value Ref Range Status  05/01/2021 0.99 0.76 - 1.27 mg/dL Final         Failed - K in normal range and within 180 days    Potassium  Date Value Ref Range Status  05/01/2021 4.4 3.5 - 5.2 mmol/L Final  04/26/2013 4.0 3.5 - 5.1 mmol/L Final         Passed - Patient is not pregnant      Passed - Last BP in normal range    BP Readings from Last 1 Encounters:  06/04/22 122/74         Passed - Valid encounter within last 6 months    Recent Outpatient Visits           1 year ago Mixed hyperlipidemia   [place] [facility] [place], [place], PA-C   1 year ago Prescription for Tdap. Vaccine not administered in office.    [facility] [place], [place], [place]   2 years ago Seasonal allergic rhinitis due to pollen   [facility] Chrismon, Jodell Cipro, PA-C   3 years ago Prostate cancer screening   [facility] Malva Limes, MD   3 years ago Essential (primary) hypertension   [place] [facility] Malva Limes, MD

## 2022-08-17 ENCOUNTER — Other Ambulatory Visit: Payer: Self-pay | Admitting: Family Medicine

## 2022-08-17 DIAGNOSIS — E782 Mixed hyperlipidemia: Secondary | ICD-10-CM

## 2022-08-19 ENCOUNTER — Encounter: Payer: Self-pay | Admitting: *Deleted

## 2022-08-19 NOTE — Telephone Encounter (Signed)
Requested medication (s) are due for refill today:   Yes  Requested medication (s) are on the active medication list:   Yes  Future visit scheduled:   No    MyChart message sent to call in and make appt for yearly physical   Last ordered: 05/24/2022 #90, 0 refills  Returned because labs are due per protocol.    It was questionable if he saw Merita Norton on 06/04/2022 for his physical.   No labs done if he did see her then.      Requested Prescriptions  Pending Prescriptions Disp Refills   atorvastatin (LIPITOR) 10 MG tablet [Pharmacy Med Name: ATORVASTATIN 10 MG TABLET] 90 tablet 0    Sig: TAKE 1 TABLET BY MOUTH EVERY DAY     Cardiovascular:  Antilipid - Statins Failed - 08/17/2022  9:34 AM      Failed - Lipid Panel in normal range within the last 12 months    Cholesterol, Total  Date Value Ref Range Status  05/01/2021 216 (H) 100 - 199 mg/dL Final   LDL Chol Calc (NIH)  Date Value Ref Range Status  05/01/2021 140 (H) 0 - 99 mg/dL Final   HDL  Date Value Ref Range Status  05/01/2021 49 >39 mg/dL Final   Triglycerides  Date Value Ref Range Status  05/01/2021 152 (H) 0 - 149 mg/dL Final         Passed - Patient is not pregnant      Passed - Valid encounter within last 12 months    Recent Outpatient Visits           1 year ago Mixed hyperlipidemia   [place] [facility] [place], [place], PA-C   1 year ago Prescription for Tdap. Vaccine not administered in office.    [facility] [place], [place], [place]   3 years ago Seasonal allergic rhinitis due to pollen   [facility] [place], Jodell Cipro, PA-C   3 years ago Prostate cancer screening   [place] [facility] Malva Limes, MD   3 years ago Essential (primary) hypertension   [place] [facility] Malva Limes, MD

## 2022-09-17 ENCOUNTER — Other Ambulatory Visit: Payer: Self-pay | Admitting: Family Medicine

## 2022-09-17 DIAGNOSIS — E782 Mixed hyperlipidemia: Secondary | ICD-10-CM

## 2022-09-18 ENCOUNTER — Other Ambulatory Visit: Payer: Self-pay | Admitting: Family Medicine

## 2022-09-18 DIAGNOSIS — E782 Mixed hyperlipidemia: Secondary | ICD-10-CM

## 2022-09-19 NOTE — Telephone Encounter (Signed)
Requested medication (s) are due for refill today: yes   Requested medication (s) are on the active medication list: yes   Last refill:  08/19/22 #30 0 refills   Future visit scheduled: no   Notes to clinic:  do you want to give another courtesy refill? Called patient LVMTCB to schedule appt for further refills.     Requested Prescriptions  Pending Prescriptions Disp Refills   atorvastatin (LIPITOR) 10 MG tablet [Pharmacy Med Name: ATORVASTATIN 10 MG TABLET] 30 tablet 0    Sig: TAKE 1 TABLET BY MOUTH EVERY DAY     Cardiovascular:  Antilipid - Statins Failed - 09/18/2022  1:37 AM      Failed - Lipid Panel in normal range within the last 12 months    Cholesterol, Total  Date Value Ref Range Status  05/01/2021 216 (H) 100 - 199 mg/dL Final   LDL Chol Calc (NIH)  Date Value Ref Range Status  05/01/2021 140 (H) 0 - 99 mg/dL Final   HDL  Date Value Ref Range Status  05/01/2021 49 >39 mg/dL Final   Triglycerides  Date Value Ref Range Status  05/01/2021 152 (H) 0 - 149 mg/dL Final         Passed - Patient is not pregnant      Passed - Valid encounter within last 12 months    Recent Outpatient Visits           1 year ago Mixed hyperlipidemia   [facility] [place], [place], PA-C   1 year ago Prescription for Tdap. Vaccine not administered in office.    [place] [facility] [place], [place], [place]   3 years ago Seasonal allergic rhinitis due to pollen   [facility] Chrismon, Jodell Cipro, PA-C   3 years ago Prostate cancer screening   [place] [facility] Malva Limes, MD   3 years ago Essential (primary) hypertension   [place] [facility] Malva Limes, MD

## 2022-09-19 NOTE — Telephone Encounter (Signed)
Called patient [phone] to schedule appt for medication refills. No answer , LVMTCB [phone].

## 2022-10-04 ENCOUNTER — Other Ambulatory Visit: Payer: Self-pay | Admitting: Family Medicine

## 2022-10-04 DIAGNOSIS — I10 Essential (primary) hypertension: Secondary | ICD-10-CM

## 2022-10-04 DIAGNOSIS — E782 Mixed hyperlipidemia: Secondary | ICD-10-CM

## 2022-10-06 ENCOUNTER — Other Ambulatory Visit: Payer: Self-pay | Admitting: Family Medicine

## 2022-10-06 DIAGNOSIS — I10 Essential (primary) hypertension: Secondary | ICD-10-CM

## 2022-12-27 ENCOUNTER — Other Ambulatory Visit: Payer: Self-pay | Admitting: Family Medicine

## 2022-12-27 DIAGNOSIS — I10 Essential (primary) hypertension: Secondary | ICD-10-CM

## 2022-12-27 NOTE — Telephone Encounter (Signed)
Requested medication (s) are due for refill today: yes  Requested medication (s) are on the active medication list: yes  Last refill:  10/04/22 #90  Future visit scheduled: no  Notes to clinic:  overdue lab work   Requested Prescriptions  Pending Prescriptions Disp Refills   lisinopril (ZESTRIL) 5 MG tablet [Pharmacy Med Name: LISINOPRIL 5 MG TABLET] 90 tablet 0    Sig: TAKE 1 TABLET (5 MG TOTAL) BY MOUTH DAILY.     Cardiovascular:  ACE Inhibitors Failed - 12/27/2022  1:45 AM      Failed - Cr in normal range and within 180 days    Creatinine  Date Value Ref Range Status  04/26/2013 0.87 0.60 - 1.30 mg/dL Final   Creatinine, Ser  Date Value Ref Range Status  05/01/2021 0.99 0.76 - 1.27 mg/dL Final         Failed - K in normal range and within 180 days    Potassium  Date Value Ref Range Status  05/01/2021 4.4 3.5 - 5.2 mmol/L Final  04/26/2013 4.0 3.5 - 5.1 mmol/L Final         Failed - Valid encounter within last 6 months    Recent Outpatient Visits           1 year ago Mixed hyperlipidemia   [place] [facility] [place], [place], PA-C   1 year ago Prescription for Tdap. Vaccine not administered in office.    [facility] [place], [place], [place]   3 years ago Seasonal allergic rhinitis due to pollen   [facility] Chrismon, Jodell Cipro, PA-C   3 years ago Prostate cancer screening   [place] [facility] Malva Limes, MD   3 years ago Essential (primary) hypertension   [place] [facility] Malva Limes, MD              Passed - Patient is not pregnant      Passed - Last BP in normal range    BP Readings from Last 1 Encounters:  06/04/22 122/74

## 2023-01-04 ENCOUNTER — Other Ambulatory Visit: Payer: Self-pay | Admitting: Family Medicine

## 2023-01-04 DIAGNOSIS — I10 Essential (primary) hypertension: Secondary | ICD-10-CM

## 2023-01-06 NOTE — Telephone Encounter (Signed)
Requested Prescriptions  Pending Prescriptions Disp Refills   amLODipine (NORVASC) 10 MG tablet [Pharmacy Med Name: AMLODIPINE BESYLATE 10 MG TAB] 90 tablet 0    Sig: TAKE 1 TABLET BY MOUTH EVERY DAY     Cardiovascular: Calcium Channel Blockers 2 Failed - 01/06/2023 11:56 AM      Failed - Valid encounter within last 6 months    Recent Outpatient Visits           1 year ago Mixed hyperlipidemia   [place] [facility] [place], [place], PA-C   1 year ago Prescription for Tdap. Vaccine not administered in office.    [facility] [place], [place], [place]   3 years ago Seasonal allergic rhinitis due to pollen   [facility] [place] [facility] Chrismon, Jodell Cipro, [place]   3 years ago Prostate cancer screening   [place] [facility] Malva Limes, MD   3 years ago Essential (primary) hypertension   [place] [facility] Malva Limes, MD              Passed - Last BP in normal range    BP Readings from Last 1 Encounters:  06/04/22 122/74         Passed - Last Heart Rate in normal range    Pulse Readings from Last 1 Encounters:  06/27/21 62

## 2023-01-06 NOTE — Telephone Encounter (Signed)
Patient called, left VM to return the call to the office to scheduled an appt for medication refill request.   

## 2023-01-11 ENCOUNTER — Other Ambulatory Visit: Payer: Self-pay | Admitting: Family Medicine

## 2023-01-11 DIAGNOSIS — I10 Essential (primary) hypertension: Secondary | ICD-10-CM

## 2023-01-11 IMAGING — US US THYROID
1 series · 14 of 25 positions shown · non-contrast
Comparison: None.

CLINICAL DATA: Thyroid enlargement on exam

EXAM:
THYROID ULTRASOUND
TECHNIQUE: Ultrasound examination of the thyroid gland and adjacent soft
tissues was performed.

[Series 1: us thyroid · 0.07mm/px · 14 of 39 slices shown]
[im 1/39]
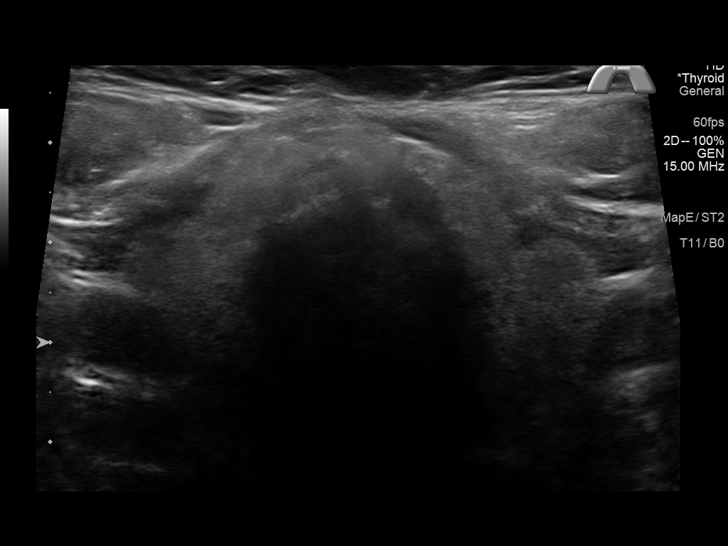
[im 4/39]
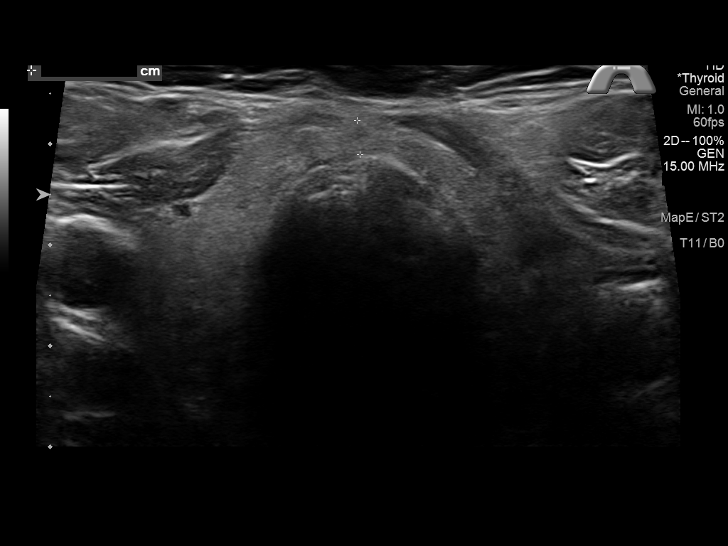
[im 7/39]
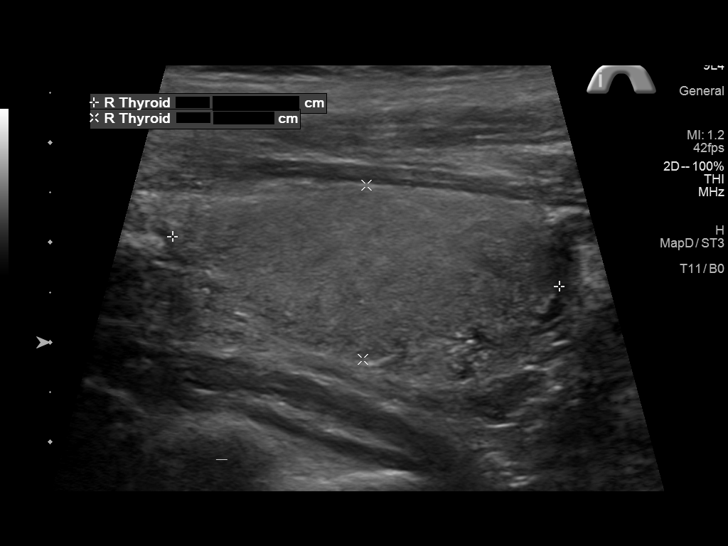
[im 10/39]
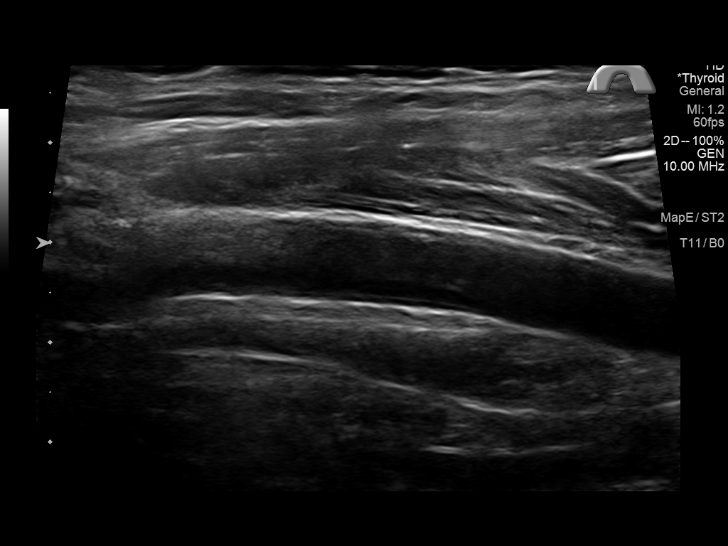
[im 13/39]
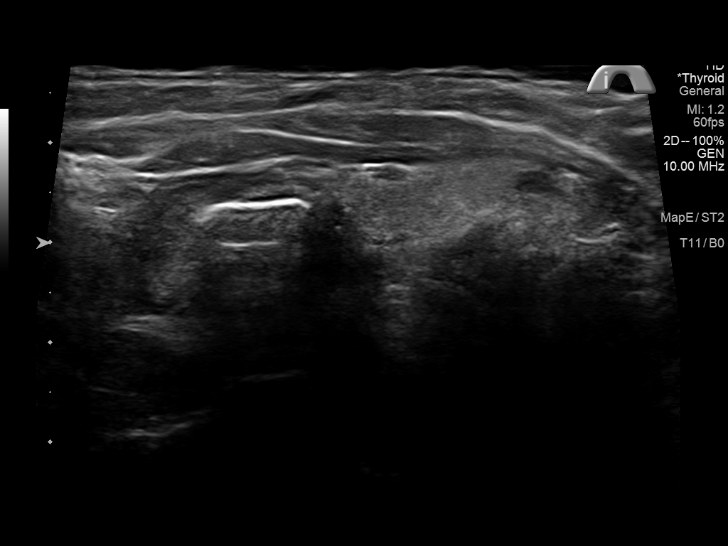
[im 15/39]
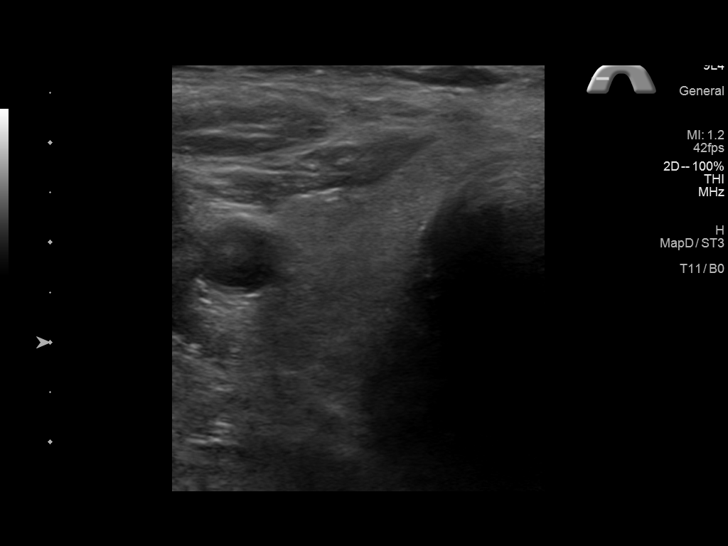
[im 18/39]
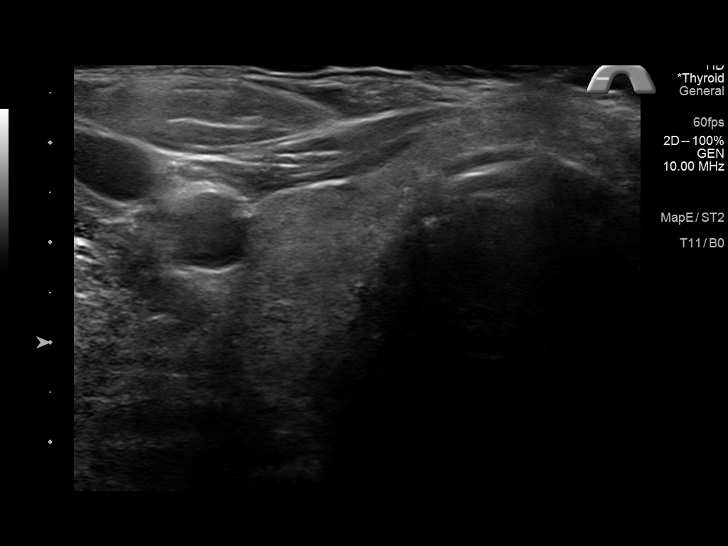
[im 21/39]
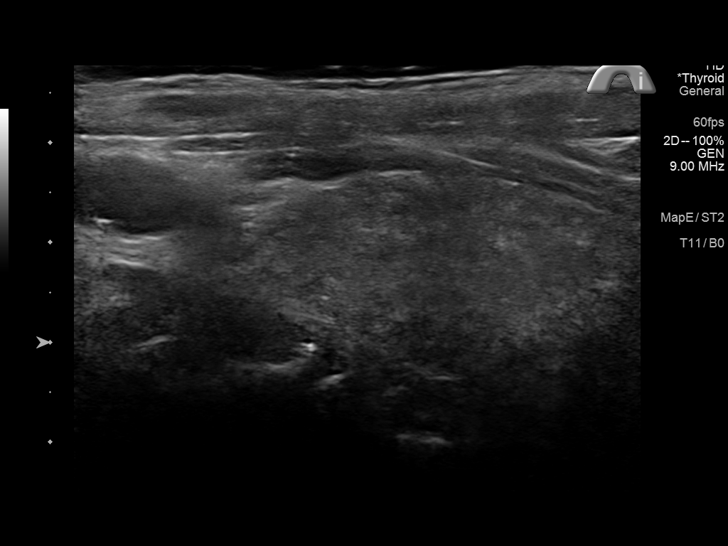
[im 24/39]
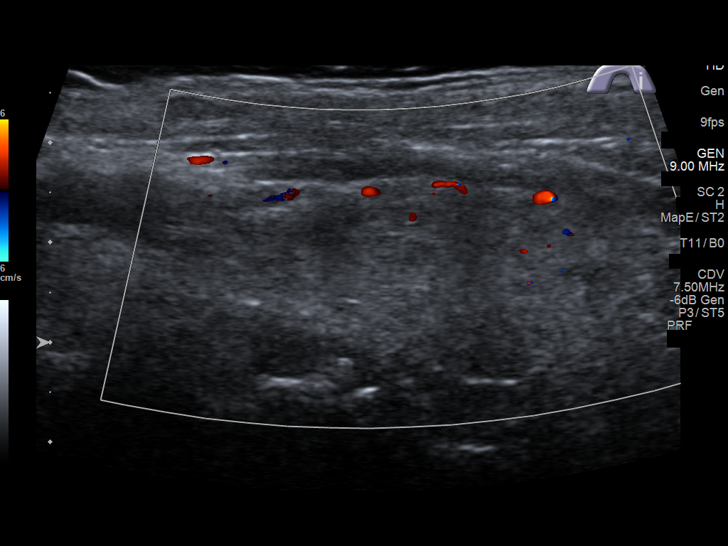
[im 26/39]
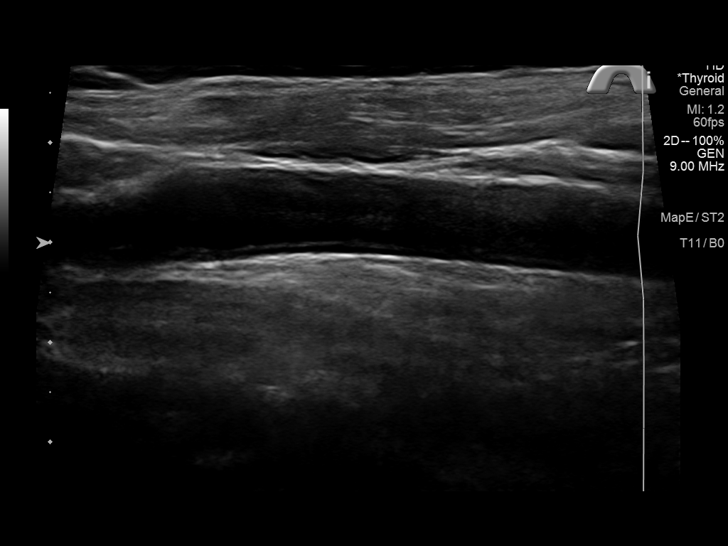
[im 29/39]
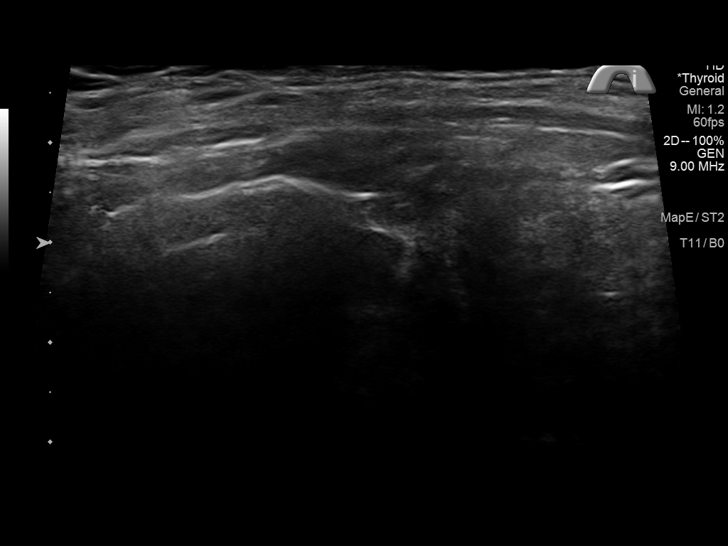
[im 32/39]
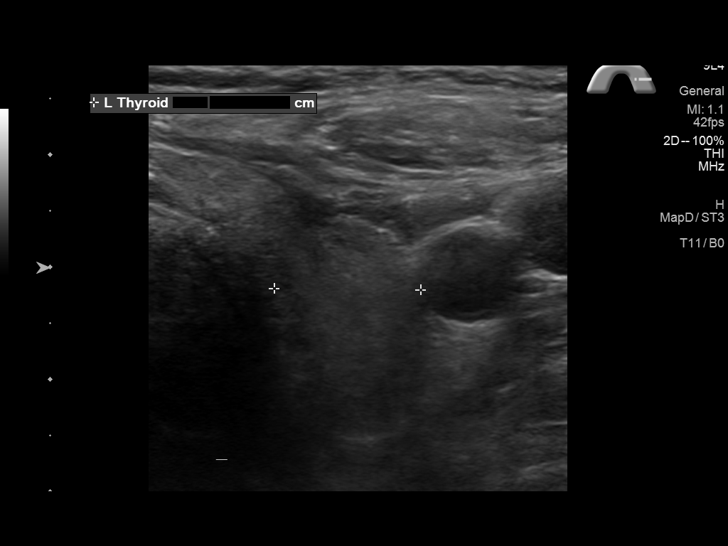
[im 35/39]
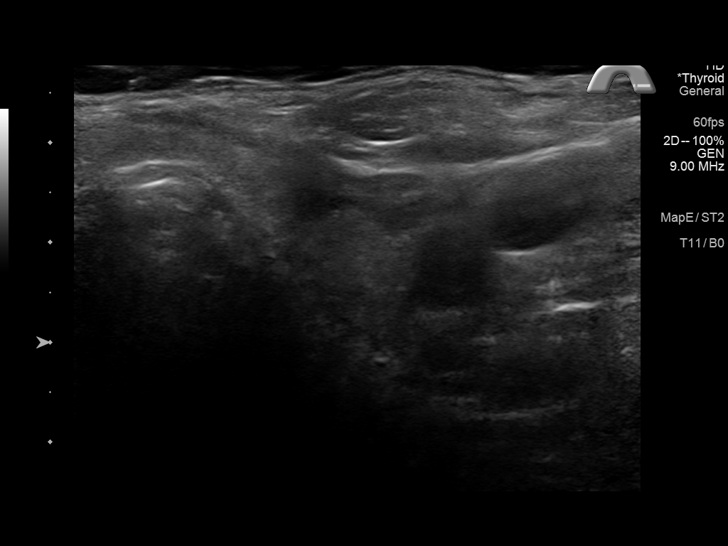
[im 39/39]
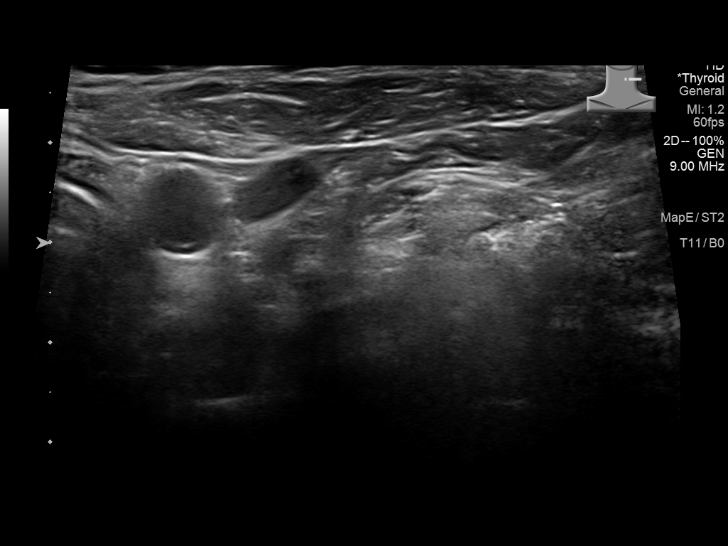

[14 of 25 positions shown; findings below may reference images not displayed]

FINDINGS: Parenchymal Echotexture: Mildly heterogenous

Isthmus: 3 mm

Right lobe: 3.9 x 1.7 x 1.4 cm

Left lobe: 3.7 x 2.0 x 1.3 cm

_________________________________________________________

Estimated total number of nodules >/= 1 cm: 0

Number of spongiform nodules >/=  2 cm not described below (TR1): 0

Number of mixed cystic and solid nodules >/= 1.5 cm not described
below (TR2): 0

_________________________________________________________

Nonspecific minor thyroid heterogeneity. No hypervascularity. No
significant nodule or focal abnormality. No regional adenopathy.
IMPRESSION: Normal thyroid ultrasound for age

The above is in keeping with the ACR TI-RADS recommendations - [HOSPITAL] 6710;[DATE].

## 2023-03-11 DIAGNOSIS — Z136 Encounter for screening for cardiovascular disorders: Secondary | ICD-10-CM | POA: Diagnosis not present

## 2023-03-11 DIAGNOSIS — Z87891 Personal history of nicotine dependence: Secondary | ICD-10-CM | POA: Diagnosis not present

## 2023-03-31 DIAGNOSIS — H40059 Ocular hypertension, unspecified eye: Secondary | ICD-10-CM | POA: Diagnosis not present

## 2023-03-31 DIAGNOSIS — H52223 Regular astigmatism, bilateral: Secondary | ICD-10-CM | POA: Diagnosis not present

## 2023-03-31 DIAGNOSIS — H5213 Myopia, bilateral: Secondary | ICD-10-CM | POA: Diagnosis not present

## 2023-03-31 DIAGNOSIS — H2513 Age-related nuclear cataract, bilateral: Secondary | ICD-10-CM | POA: Diagnosis not present

## 2023-08-26 DIAGNOSIS — I10 Essential (primary) hypertension: Secondary | ICD-10-CM | POA: Diagnosis not present

## 2023-08-26 DIAGNOSIS — E782 Mixed hyperlipidemia: Secondary | ICD-10-CM | POA: Diagnosis not present

## 2023-08-26 DIAGNOSIS — Z1331 Encounter for screening for depression: Secondary | ICD-10-CM | POA: Diagnosis not present
